# Patient Record
Sex: Female | Born: 1970 | Race: White | Hispanic: No | Marital: Married
Health system: Southern US, Community
[De-identification: ages and names within clinical notes are randomized; demographics above are authoritative.]

## PROBLEM LIST (undated history)

## (undated) DIAGNOSIS — M1990 Unspecified osteoarthritis, unspecified site: Secondary | ICD-10-CM

## (undated) DIAGNOSIS — D649 Anemia, unspecified: Secondary | ICD-10-CM

## (undated) HISTORY — DX: Unspecified osteoarthritis, unspecified site: M19.90

## (undated) HISTORY — DX: Anemia, unspecified: D64.9

## (undated) HISTORY — PX: TOTAL HIP ARTHROPLASTY: SHX124

## (undated) HISTORY — PX: CHOLECYSTECTOMY: SHX55

---

## 2005-02-06 ENCOUNTER — Other Ambulatory Visit: Admission: RE | Admit: 2005-02-06 | Discharge: 2005-02-06 | Payer: Self-pay | Admitting: Obstetrics and Gynecology

## 2005-07-12 ENCOUNTER — Encounter (INDEPENDENT_AMBULATORY_CARE_PROVIDER_SITE_OTHER): Payer: Self-pay | Admitting: *Deleted

## 2005-07-12 ENCOUNTER — Ambulatory Visit (HOSPITAL_COMMUNITY): Admission: RE | Admit: 2005-07-12 | Discharge: 2005-07-12 | Payer: Self-pay | Admitting: Obstetrics and Gynecology

## 2008-08-19 ENCOUNTER — Ambulatory Visit (HOSPITAL_COMMUNITY): Admission: RE | Admit: 2008-08-19 | Discharge: 2008-08-19 | Payer: Self-pay | Admitting: Internal Medicine

## 2008-10-11 ENCOUNTER — Ambulatory Visit (HOSPITAL_COMMUNITY): Admission: RE | Admit: 2008-10-11 | Discharge: 2008-10-11 | Payer: Self-pay | Admitting: Surgery

## 2008-10-11 ENCOUNTER — Encounter (INDEPENDENT_AMBULATORY_CARE_PROVIDER_SITE_OTHER): Payer: Self-pay | Admitting: Interventional Radiology

## 2009-07-15 HISTORY — PX: ABDOMINAL HYSTERECTOMY: SHX81

## 2009-08-21 ENCOUNTER — Encounter: Admission: RE | Admit: 2009-08-21 | Discharge: 2009-08-21 | Payer: Self-pay | Admitting: Surgery

## 2010-05-21 ENCOUNTER — Encounter (INDEPENDENT_AMBULATORY_CARE_PROVIDER_SITE_OTHER): Payer: Self-pay | Admitting: Obstetrics and Gynecology

## 2010-05-21 ENCOUNTER — Ambulatory Visit (HOSPITAL_COMMUNITY): Admission: RE | Admit: 2010-05-21 | Discharge: 2010-05-22 | Payer: Self-pay | Admitting: Obstetrics and Gynecology

## 2010-07-10 ENCOUNTER — Encounter
Admission: RE | Admit: 2010-07-10 | Discharge: 2010-07-10 | Payer: Self-pay | Source: Home / Self Care | Attending: Surgery | Admitting: Surgery

## 2010-08-04 ENCOUNTER — Other Ambulatory Visit: Payer: Self-pay | Admitting: Surgery

## 2010-08-04 DIAGNOSIS — E042 Nontoxic multinodular goiter: Secondary | ICD-10-CM

## 2010-08-05 ENCOUNTER — Encounter: Payer: Self-pay | Admitting: Surgery

## 2010-09-25 LAB — CBC
HCT: 30.9 % — ABNORMAL LOW (ref 36.0–46.0)
Hemoglobin: 10.6 g/dL — ABNORMAL LOW (ref 12.0–15.0)
MCH: 29.3 pg (ref 26.0–34.0)
MCH: 29.9 pg (ref 26.0–34.0)
MCHC: 33.7 g/dL (ref 30.0–36.0)
MCHC: 34.4 g/dL (ref 30.0–36.0)
MCV: 86.9 fL (ref 78.0–100.0)
Platelets: 216 10*3/uL (ref 150–400)
RBC: 4.52 MIL/uL (ref 3.87–5.11)

## 2010-11-30 NOTE — Op Note (Signed)
NAME:  [name], Carrie             ACCOUNT NO.:  [id number]   MEDICAL RECORD NO.:  [id number]          PATIENT TYPE:  AMB   LOCATION:  SDC                           FACILITY:  [facility]   PHYSICIAN:  Juluis Mire, M.D.   DATE OF BIRTH:  1970/12/13   DATE OF PROCEDURE:  07/12/2005  DATE OF DISCHARGE:                                 OPERATIVE REPORT   PREOPERATIVE DIAGNOSIS:  Menorrhagia.   POSTOPERATIVE DIAGNOSIS:  Menorrhagia.   OPERATION/PROCEDURE:  1.  Hysteroscopy.  2.  NovaSure ablation of the endometrium.   SURGEON:  Juluis Mire, M.D.   ANESTHESIA:  General.   ESTIMATED BLOOD LOSS:  Minimal.   PACKS AND DRAINS:  None.   INTRAOPERATIVE BLOOD REPLACED:  None.   COMPLICATIONS:  None.   INDICATIONS:  Dictated in history and physical.   DESCRIPTION OF PROCEDURE:  The patient was taken to the OR and placed in the  supine position.  After satisfactory level of general anesthesia was  obtained, the patient was placed in the dorsal lithotomy position using the  Allen stirrups.  Perineum and vagina were prepped out with Betadine and  draped in a sterile field.  Speculum was placed in the vaginal vault.  Uterus sounded to approximately 11 cm.  Cervix canal was 6 cm with a cavity  length of 5.  We dilated the cervix to a size 26 Pratt dilator.  Non-  operative hysteroscope was introduced.  Intrauterine cavity was distended  using lactated Ringer's.  Endometrial cavity was relatively large but there  was no evidence of polyps or other abnormalities. Endometrial curettings  were obtained and sent for pathologic review.  No signs of perforation were  noted. The NovaSure was then put in place and expanded.  We had a cavity  width of 5.2 cm.  The cavity assessment revealed no signs of perforation.  Ablation was undertaken for 85 seconds at a power of  143.  The NovaSure was then removed intact.  Speculum and single-tooth  tenaculum were removed.  The patient was taken out of the  dorsal lithotomy  position and once alert and extubated, was transferred to the recovery room  in good condition.  Sponge, instrument and needle counts reported as correct  by the circulating nurse.      Juluis Mire, M.D.  Electronically Signed     JSM/MEDQ  D:  07/12/2005  T:  07/12/2005  Job:  [id number]

## 2010-11-30 NOTE — H&P (Signed)
NAME:  Carrie [name], Carrie [name] NO.:  [id number]   MEDICAL RECORD NO.:  [id number]         PATIENT TYPE:  AMB   LOCATION:                                FACILITY:  [facility]   PHYSICIAN:  Juluis Mire, M.D.   DATE OF BIRTH:  31-Aug-1970   DATE OF ADMISSION:  07/12/2005  DATE OF DISCHARGE:  07/12/2005                                HISTORY & PHYSICAL   40 year old gravida 3, para 2, abortus 1 female presents for hysteroscopy  and NovaSure ablation.   In relation to the present admission patient's cycles are regular.  She has  three to seven days of flow, three days being heavy changing pads and  tampons every 15 minutes with significant clots and cramping.  She also has  some pains during deep penetration.  A saline infusion ultrasound had  revealed some small intramural fibroids, otherwise endometrium was  unremarkable.  No polyps were noted.  We had discussed options including  birth control pills versus Mirena IUD versus ablation versus hysterectomy.  The patient now presents for ablation.  Her husband has had a prior  vasectomy.   ALLERGIES:  She has no known drug allergies.   MEDICATIONS:  None.   PAST MEDICAL HISTORY:  Usual childhood diseases.  No significant sequelae.  She has no previous surgical history.  She has had two vaginal deliveries  and one TAB.   FAMILY HISTORY:  Noncontributory.   SOCIAL HISTORY:  Noncontributory.   REVIEW OF SYSTEMS:  Noncontributory.   PHYSICAL EXAMINATION:  VITAL SIGNS:  Patient is afebrile with stable vital  signs.  HEENT:  Patient normocephalic.  Pupils are equal, round, and reactive to  light and accommodation.  Extraocular movements are intact.  Sclerae and  conjunctivae clear.  Oropharynx clear.  NECK:  Without thyromegaly.  BREASTS:  No discrete masses.  LUNGS:  Clear.  CARDIOVASCULAR:  Regular rhythm, rate without murmurs or gallops.  ABDOMEN:  Benign.  PELVIC:  Normal external genitalia.  Vaginal mucosa is clear.   Cervix  unremarkable.  Uterus normal size, shape, and contour.  Adnexa free of  masses or tenderness.  EXTREMITIES:  Trace edema.  NEUROLOGIC:  Grossly within normal limits.   IMPRESSION:  1.  Menorrhagia secondary to adenomyosis.  2.  Uterine fibroids.   PLAN:  Patient undergo hysteroscopic evaluation, biopsy, and endometrial  ablation.  The risks of surgery have been discussed including the risk of  infection, risk of vascular injury that could lead to hemorrhage requiring  transfusion and possible hysterectomy, risk of injury to adjacent organs  through perforation that could require further exploratory surgery, risk of  deep venous thrombosis and pulmonary embolus.  Patient expressed  understanding of indications, risks, and alternatives.      Juluis Mire, M.D.  Electronically Signed     JSM/MEDQ  D:  07/12/2005  T:  07/12/2005  Job:  [id number]

## 2010-12-05 ENCOUNTER — Encounter (INDEPENDENT_AMBULATORY_CARE_PROVIDER_SITE_OTHER): Payer: Self-pay | Admitting: Surgery

## 2011-06-18 ENCOUNTER — Ambulatory Visit
Admission: RE | Admit: 2011-06-18 | Discharge: 2011-06-18 | Disposition: A | Discharge status: Home / Self Care | Payer: BC Managed Care – PPO | Source: Ambulatory Visit | Attending: Surgery | Admitting: Surgery

## 2011-06-18 DIAGNOSIS — E042 Nontoxic multinodular goiter: Secondary | ICD-10-CM

## 2012-12-15 IMAGING — US US SOFT TISSUE HEAD/NECK
1 series · 14 of 25 positions shown · non-contrast
Comparison: 08/21/2009

CLINICAL DATA: Thyroid nodules.

THYROID ULTRASOUND
TECHNIQUE: Ultrasound examination of the thyroid gland and adjacent
soft tissues was performed.

[Series 1: us soft tissue head/neck · 0.07mm/px · 14 of 48 slices shown]
[im 1/48]
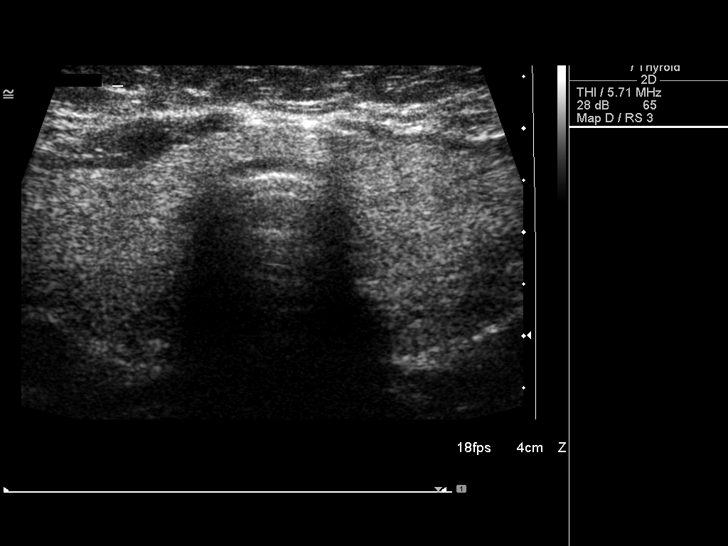
[im 4/48]
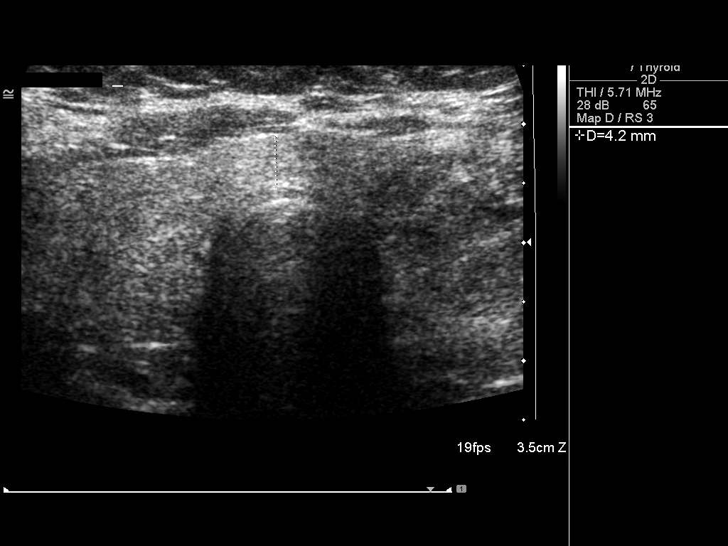
[im 8/48]
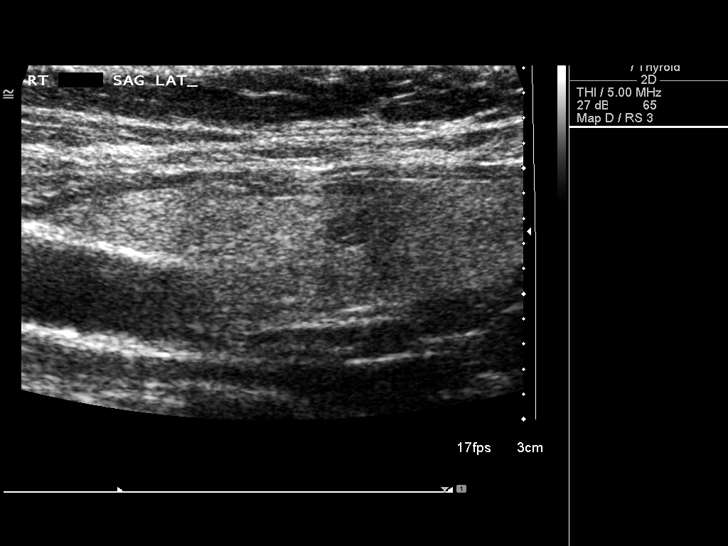
[im 12/48]
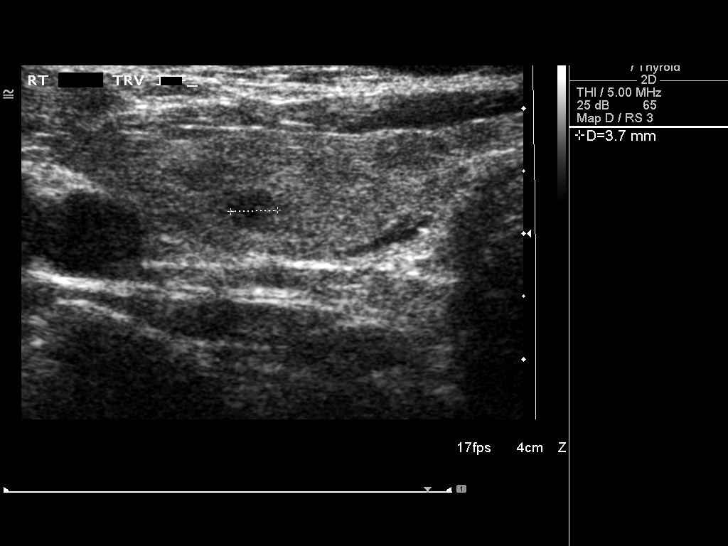
[im 16/48]
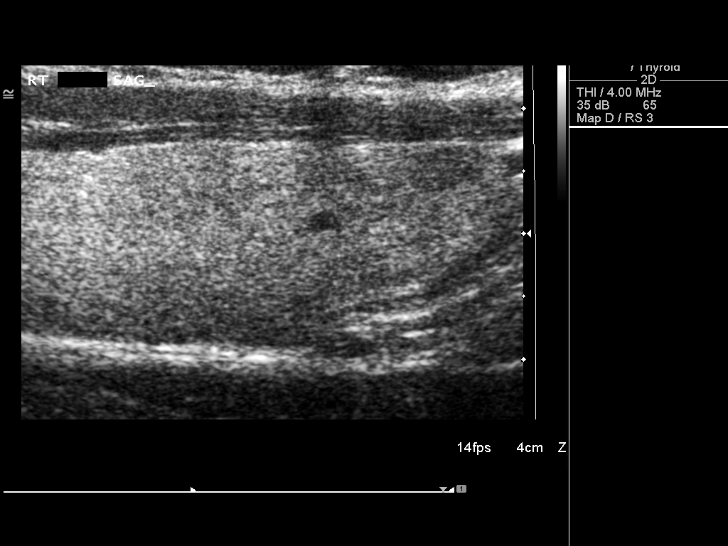
[im 18/48]
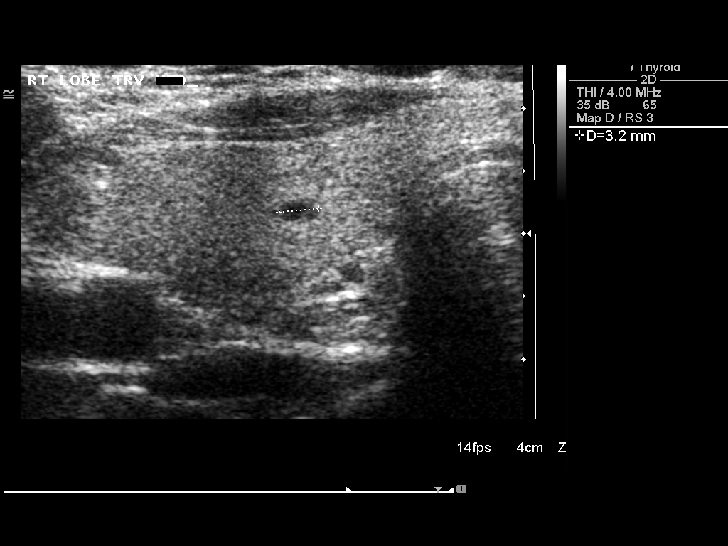
[im 22/48]
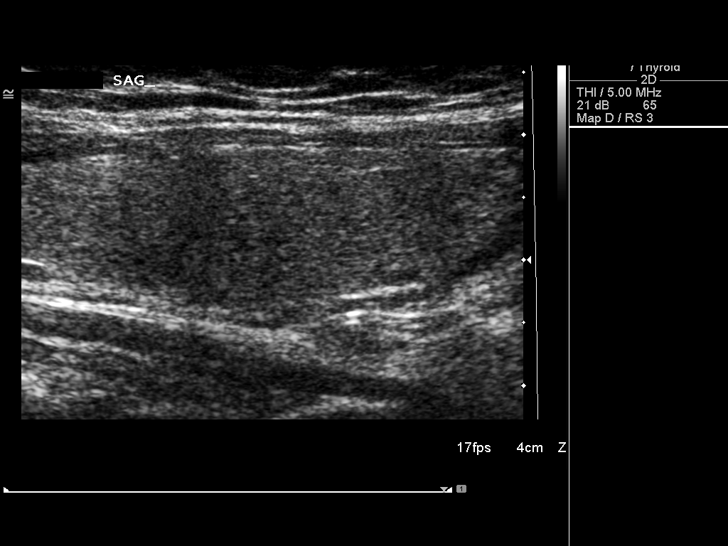
[im 26/48]
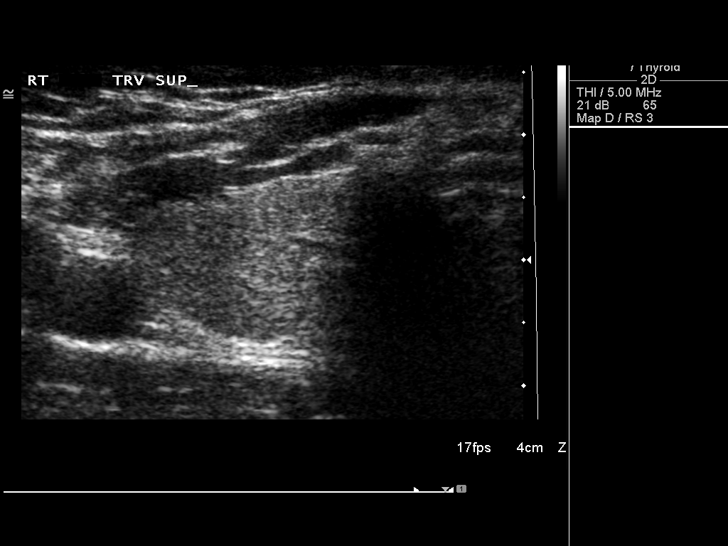
[im 30/48]
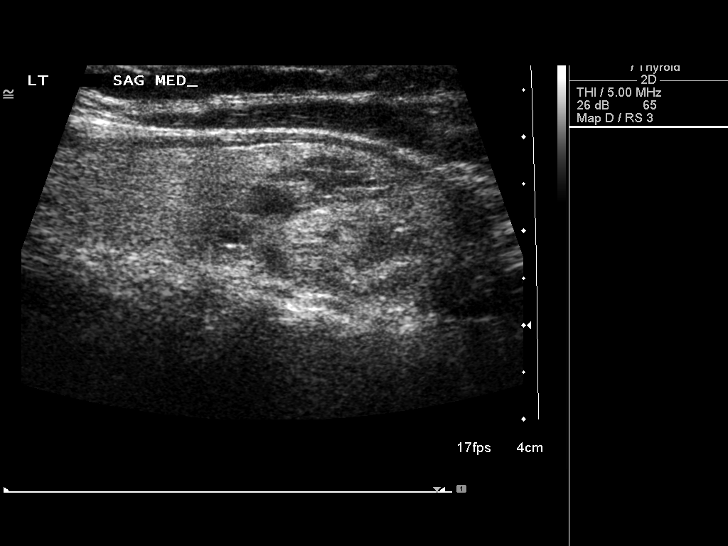
[im 32/48]
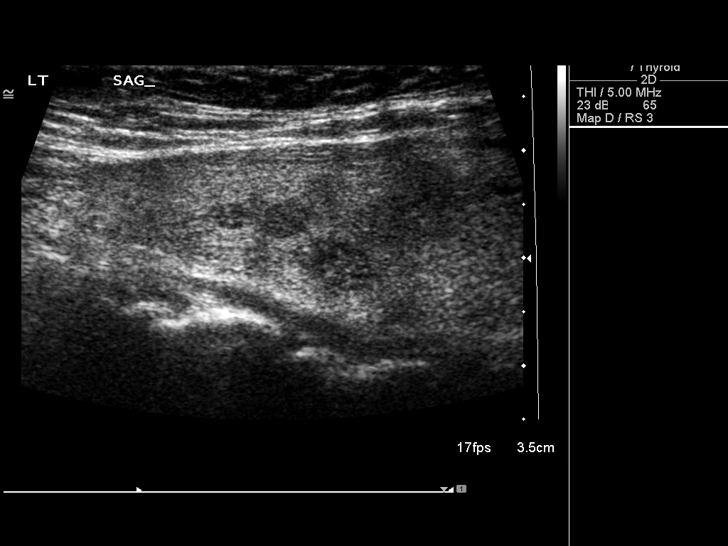
[im 36/48]
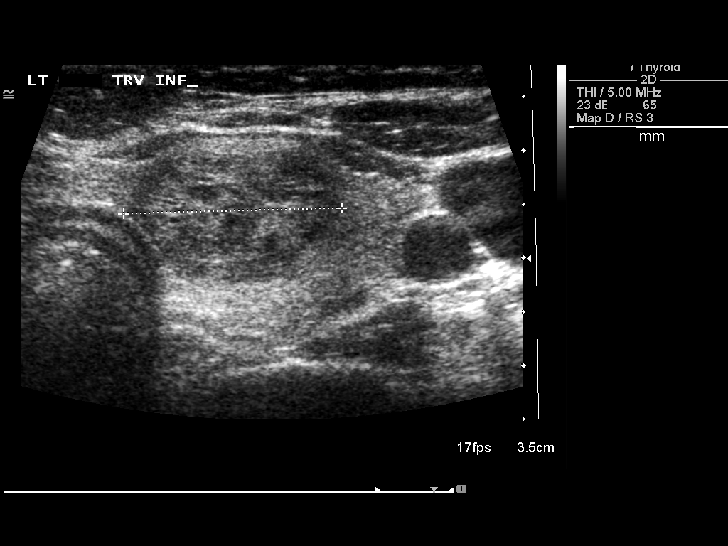
[im 40/48]
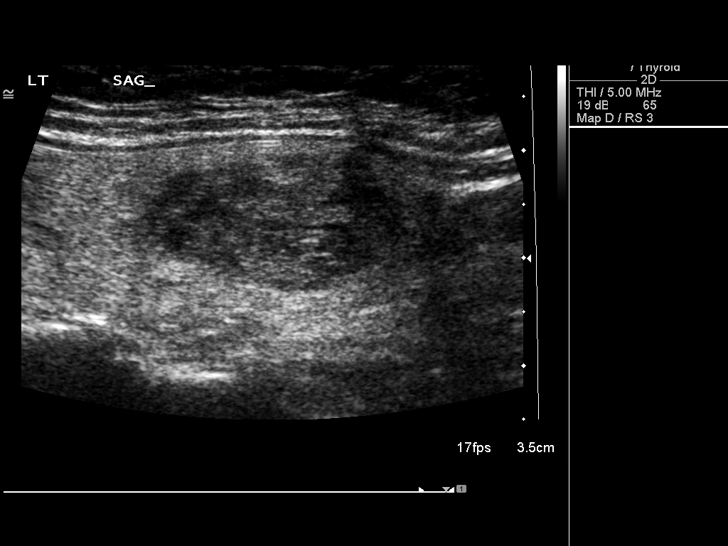
[im 44/48]
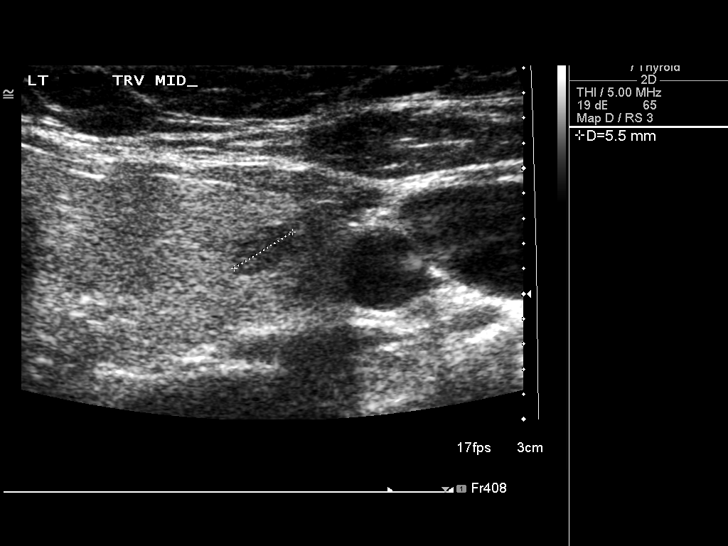
[im 48/48]
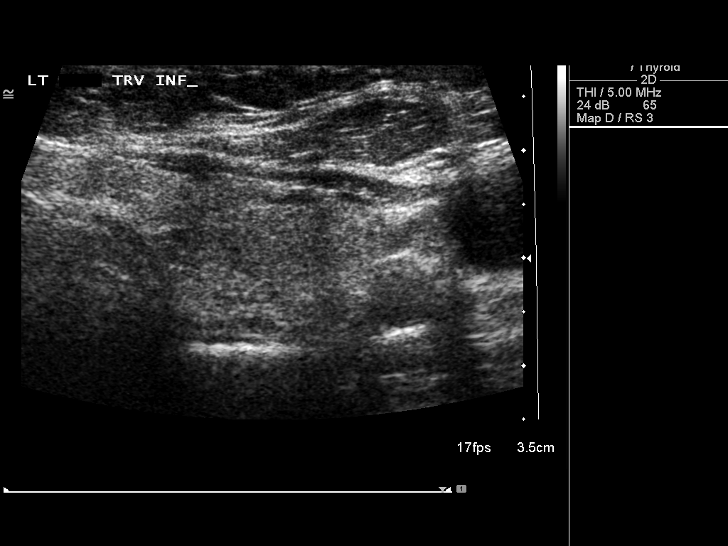

[14 of 25 positions shown; findings below may reference images not displayed]

FINDINGS: Right thyroid lobe:  5.1 x 1.9 x 2.6 cm
Left thyroid lobe:  5.4 x 1.8 x 2.2 cm
Isthmus:  0.4 cm

Focal nodules:  There are multiple small nodules in both lobes.
There is a dominant nodule in the medial aspect of the lower pole
of the left lobe measuring 2.5 x 1.3 x 2.0 cm, essentially
unchanged since the prior exam.

Lymphadenopathy:  None visualized.
IMPRESSION: Multi nodular goiter with no significant change since
the prior exam.

## 2019-09-20 ENCOUNTER — Other Ambulatory Visit: Payer: Self-pay | Admitting: Obstetrics and Gynecology

## 2019-09-20 DIAGNOSIS — E041 Nontoxic single thyroid nodule: Secondary | ICD-10-CM

## 2019-09-24 ENCOUNTER — Other Ambulatory Visit: Payer: Self-pay

## 2019-09-27 ENCOUNTER — Ambulatory Visit
Admission: RE | Admit: 2019-09-27 | Discharge: 2019-09-27 | Disposition: A | Discharge status: Home / Self Care | Payer: Managed Care, Other (non HMO) | Source: Ambulatory Visit | Attending: Obstetrics and Gynecology | Admitting: Obstetrics and Gynecology

## 2019-09-27 DIAGNOSIS — E041 Nontoxic single thyroid nodule: Secondary | ICD-10-CM

## 2020-04-26 DIAGNOSIS — M545 Low back pain, unspecified: Secondary | ICD-10-CM | POA: Diagnosis not present

## 2020-04-26 DIAGNOSIS — M25551 Pain in right hip: Secondary | ICD-10-CM | POA: Diagnosis not present

## 2020-06-15 ENCOUNTER — Other Ambulatory Visit: Payer: Self-pay | Admitting: Obstetrics and Gynecology

## 2020-06-15 DIAGNOSIS — E041 Nontoxic single thyroid nodule: Secondary | ICD-10-CM

## 2020-06-22 DIAGNOSIS — S338XXA Sprain of other parts of lumbar spine and pelvis, initial encounter: Secondary | ICD-10-CM | POA: Diagnosis not present

## 2020-06-22 DIAGNOSIS — S134XXA Sprain of ligaments of cervical spine, initial encounter: Secondary | ICD-10-CM | POA: Diagnosis not present

## 2020-06-22 DIAGNOSIS — S233XXA Sprain of ligaments of thoracic spine, initial encounter: Secondary | ICD-10-CM | POA: Diagnosis not present

## 2020-09-19 DIAGNOSIS — R002 Palpitations: Secondary | ICD-10-CM | POA: Diagnosis not present

## 2020-09-19 DIAGNOSIS — Z20822 Contact with and (suspected) exposure to covid-19: Secondary | ICD-10-CM | POA: Diagnosis not present

## 2020-09-19 DIAGNOSIS — Z7689 Persons encountering health services in other specified circumstances: Secondary | ICD-10-CM | POA: Diagnosis not present

## 2020-09-19 DIAGNOSIS — E785 Hyperlipidemia, unspecified: Secondary | ICD-10-CM | POA: Diagnosis not present

## 2020-09-19 DIAGNOSIS — Z20828 Contact with and (suspected) exposure to other viral communicable diseases: Secondary | ICD-10-CM | POA: Diagnosis not present

## 2020-10-02 DIAGNOSIS — J111 Influenza due to unidentified influenza virus with other respiratory manifestations: Secondary | ICD-10-CM | POA: Diagnosis not present

## 2020-10-02 DIAGNOSIS — Z20828 Contact with and (suspected) exposure to other viral communicable diseases: Secondary | ICD-10-CM | POA: Diagnosis not present

## 2020-10-02 DIAGNOSIS — J029 Acute pharyngitis, unspecified: Secondary | ICD-10-CM | POA: Diagnosis not present

## 2020-11-03 DIAGNOSIS — G4700 Insomnia, unspecified: Secondary | ICD-10-CM | POA: Diagnosis not present

## 2020-11-03 DIAGNOSIS — Z20828 Contact with and (suspected) exposure to other viral communicable diseases: Secondary | ICD-10-CM | POA: Diagnosis not present

## 2020-11-03 DIAGNOSIS — R002 Palpitations: Secondary | ICD-10-CM | POA: Diagnosis not present

## 2020-11-09 DIAGNOSIS — R002 Palpitations: Secondary | ICD-10-CM | POA: Diagnosis not present

## 2020-12-06 DIAGNOSIS — Z1331 Encounter for screening for depression: Secondary | ICD-10-CM | POA: Diagnosis not present

## 2020-12-06 DIAGNOSIS — F411 Generalized anxiety disorder: Secondary | ICD-10-CM | POA: Diagnosis not present

## 2020-12-06 DIAGNOSIS — G4700 Insomnia, unspecified: Secondary | ICD-10-CM | POA: Diagnosis not present

## 2020-12-06 DIAGNOSIS — Z1389 Encounter for screening for other disorder: Secondary | ICD-10-CM | POA: Diagnosis not present

## 2021-01-01 DIAGNOSIS — Z6830 Body mass index (BMI) 30.0-30.9, adult: Secondary | ICD-10-CM | POA: Diagnosis not present

## 2021-01-01 DIAGNOSIS — Z01419 Encounter for gynecological examination (general) (routine) without abnormal findings: Secondary | ICD-10-CM | POA: Diagnosis not present

## 2021-01-01 DIAGNOSIS — Z1231 Encounter for screening mammogram for malignant neoplasm of breast: Secondary | ICD-10-CM | POA: Diagnosis not present

## 2021-01-22 DIAGNOSIS — E042 Nontoxic multinodular goiter: Secondary | ICD-10-CM | POA: Diagnosis not present

## 2021-01-22 DIAGNOSIS — Z8639 Personal history of other endocrine, nutritional and metabolic disease: Secondary | ICD-10-CM | POA: Diagnosis not present

## 2021-01-22 DIAGNOSIS — Z8349 Family history of other endocrine, nutritional and metabolic diseases: Secondary | ICD-10-CM | POA: Diagnosis not present

## 2021-01-22 DIAGNOSIS — E781 Pure hyperglyceridemia: Secondary | ICD-10-CM | POA: Diagnosis not present

## 2021-02-13 ENCOUNTER — Other Ambulatory Visit: Payer: Self-pay | Admitting: Obstetrics and Gynecology

## 2021-02-13 DIAGNOSIS — E041 Nontoxic single thyroid nodule: Secondary | ICD-10-CM

## 2021-02-21 ENCOUNTER — Other Ambulatory Visit: Payer: Managed Care, Other (non HMO)

## 2021-02-21 ENCOUNTER — Other Ambulatory Visit: Payer: Self-pay

## 2021-02-21 ENCOUNTER — Ambulatory Visit
Admission: RE | Admit: 2021-02-21 | Discharge: 2021-02-21 | Disposition: A | Discharge status: Home / Self Care | Payer: Managed Care, Other (non HMO) | Source: Ambulatory Visit | Attending: Internal Medicine | Admitting: Internal Medicine

## 2021-02-21 ENCOUNTER — Other Ambulatory Visit: Payer: Self-pay | Admitting: Internal Medicine

## 2021-02-21 DIAGNOSIS — Z8349 Family history of other endocrine, nutritional and metabolic diseases: Secondary | ICD-10-CM

## 2021-02-21 DIAGNOSIS — E042 Nontoxic multinodular goiter: Secondary | ICD-10-CM

## 2021-02-21 DIAGNOSIS — E041 Nontoxic single thyroid nodule: Secondary | ICD-10-CM | POA: Diagnosis not present

## 2021-03-23 DIAGNOSIS — S233XXA Sprain of ligaments of thoracic spine, initial encounter: Secondary | ICD-10-CM | POA: Diagnosis not present

## 2021-03-23 DIAGNOSIS — S134XXA Sprain of ligaments of cervical spine, initial encounter: Secondary | ICD-10-CM | POA: Diagnosis not present

## 2021-03-30 DIAGNOSIS — S233XXA Sprain of ligaments of thoracic spine, initial encounter: Secondary | ICD-10-CM | POA: Diagnosis not present

## 2021-03-30 DIAGNOSIS — S134XXA Sprain of ligaments of cervical spine, initial encounter: Secondary | ICD-10-CM | POA: Diagnosis not present

## 2021-04-06 DIAGNOSIS — S134XXA Sprain of ligaments of cervical spine, initial encounter: Secondary | ICD-10-CM | POA: Diagnosis not present

## 2021-04-06 DIAGNOSIS — S233XXA Sprain of ligaments of thoracic spine, initial encounter: Secondary | ICD-10-CM | POA: Diagnosis not present

## 2021-04-12 DIAGNOSIS — S134XXA Sprain of ligaments of cervical spine, initial encounter: Secondary | ICD-10-CM | POA: Diagnosis not present

## 2021-04-12 DIAGNOSIS — S233XXA Sprain of ligaments of thoracic spine, initial encounter: Secondary | ICD-10-CM | POA: Diagnosis not present

## 2021-04-26 DIAGNOSIS — S134XXA Sprain of ligaments of cervical spine, initial encounter: Secondary | ICD-10-CM | POA: Diagnosis not present

## 2021-04-26 DIAGNOSIS — S233XXA Sprain of ligaments of thoracic spine, initial encounter: Secondary | ICD-10-CM | POA: Diagnosis not present

## 2021-06-03 DIAGNOSIS — M791 Myalgia, unspecified site: Secondary | ICD-10-CM | POA: Diagnosis not present

## 2021-06-03 DIAGNOSIS — J029 Acute pharyngitis, unspecified: Secondary | ICD-10-CM | POA: Diagnosis not present

## 2021-06-03 DIAGNOSIS — J209 Acute bronchitis, unspecified: Secondary | ICD-10-CM | POA: Diagnosis not present

## 2021-06-03 DIAGNOSIS — Z20822 Contact with and (suspected) exposure to covid-19: Secondary | ICD-10-CM | POA: Diagnosis not present

## 2021-07-06 DIAGNOSIS — Z20822 Contact with and (suspected) exposure to covid-19: Secondary | ICD-10-CM | POA: Diagnosis not present

## 2021-07-06 DIAGNOSIS — U071 COVID-19: Secondary | ICD-10-CM | POA: Diagnosis not present

## 2021-10-24 DIAGNOSIS — S233XXA Sprain of ligaments of thoracic spine, initial encounter: Secondary | ICD-10-CM | POA: Diagnosis not present

## 2021-10-24 DIAGNOSIS — S134XXA Sprain of ligaments of cervical spine, initial encounter: Secondary | ICD-10-CM | POA: Diagnosis not present

## 2021-11-07 DIAGNOSIS — S233XXA Sprain of ligaments of thoracic spine, initial encounter: Secondary | ICD-10-CM | POA: Diagnosis not present

## 2021-11-07 DIAGNOSIS — S134XXA Sprain of ligaments of cervical spine, initial encounter: Secondary | ICD-10-CM | POA: Diagnosis not present

## 2022-02-13 DIAGNOSIS — Z1272 Encounter for screening for malignant neoplasm of vagina: Secondary | ICD-10-CM | POA: Diagnosis not present

## 2022-02-13 DIAGNOSIS — Z1231 Encounter for screening mammogram for malignant neoplasm of breast: Secondary | ICD-10-CM | POA: Diagnosis not present

## 2022-02-13 DIAGNOSIS — Z01419 Encounter for gynecological examination (general) (routine) without abnormal findings: Secondary | ICD-10-CM | POA: Diagnosis not present

## 2022-02-13 DIAGNOSIS — Z1151 Encounter for screening for human papillomavirus (HPV): Secondary | ICD-10-CM | POA: Diagnosis not present

## 2022-02-13 DIAGNOSIS — Z6831 Body mass index (BMI) 31.0-31.9, adult: Secondary | ICD-10-CM | POA: Diagnosis not present

## 2022-02-15 ENCOUNTER — Other Ambulatory Visit: Payer: Self-pay | Admitting: Obstetrics and Gynecology

## 2022-02-15 DIAGNOSIS — E042 Nontoxic multinodular goiter: Secondary | ICD-10-CM

## 2022-02-18 ENCOUNTER — Other Ambulatory Visit: Payer: Self-pay | Admitting: Obstetrics and Gynecology

## 2022-02-18 DIAGNOSIS — R928 Other abnormal and inconclusive findings on diagnostic imaging of breast: Secondary | ICD-10-CM

## 2022-02-27 ENCOUNTER — Ambulatory Visit
Admission: RE | Admit: 2022-02-27 | Discharge: 2022-02-27 | Disposition: A | Discharge status: Home / Self Care | Payer: BC Managed Care – PPO | Source: Ambulatory Visit | Attending: Obstetrics and Gynecology | Admitting: Obstetrics and Gynecology

## 2022-02-27 DIAGNOSIS — E042 Nontoxic multinodular goiter: Secondary | ICD-10-CM

## 2022-03-04 IMAGING — US US THYROID
1 series · 13 of 25 positions shown · non-contrast
Comparison: 06/18/2011 and 07/10/2010

CLINICAL DATA: Follow-up thyroid nodules. Dominant left thyroid
nodule was biopsied in 8848.

EXAM:
THYROID ULTRASOUND
TECHNIQUE: Ultrasound examination of the thyroid gland and adjacent soft
tissues was performed.

[Series 1: us thyroid · 0.07mm/px · 13 of 46 slices shown]
[im 1/46]
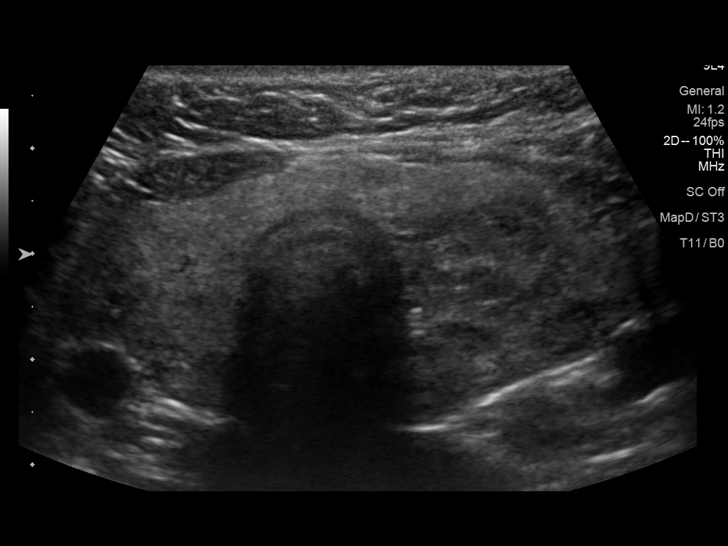
[im 4/46]
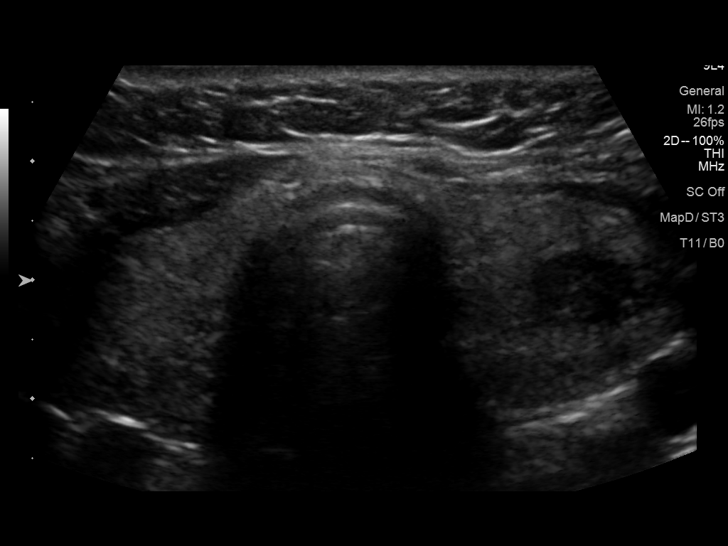
[im 8/46]
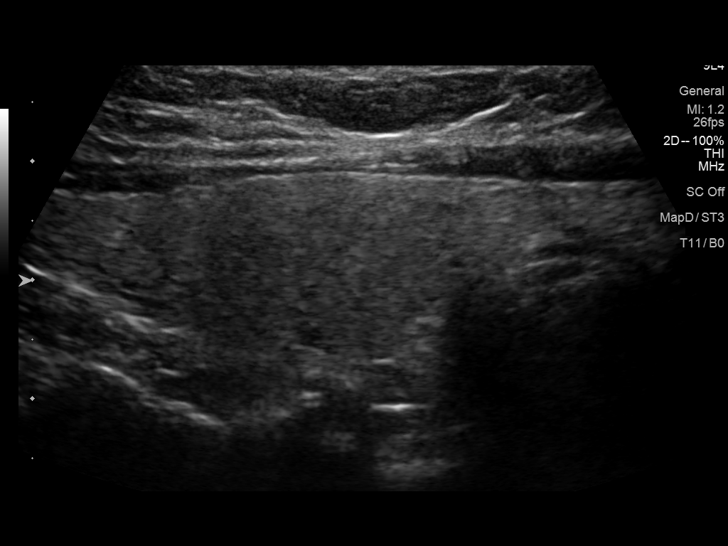
[im 12/46]
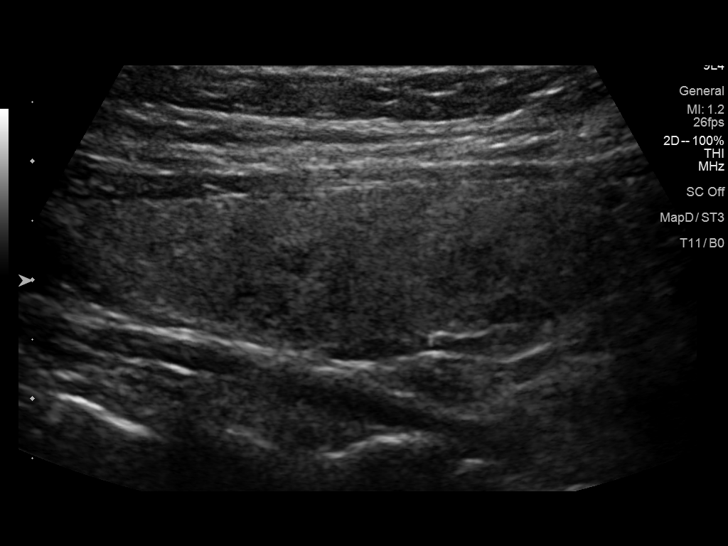
[im 16/46]
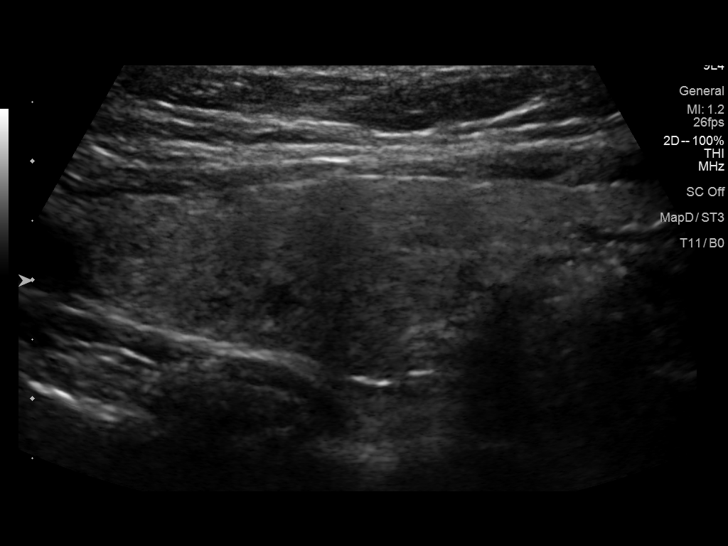
[im 19/46]
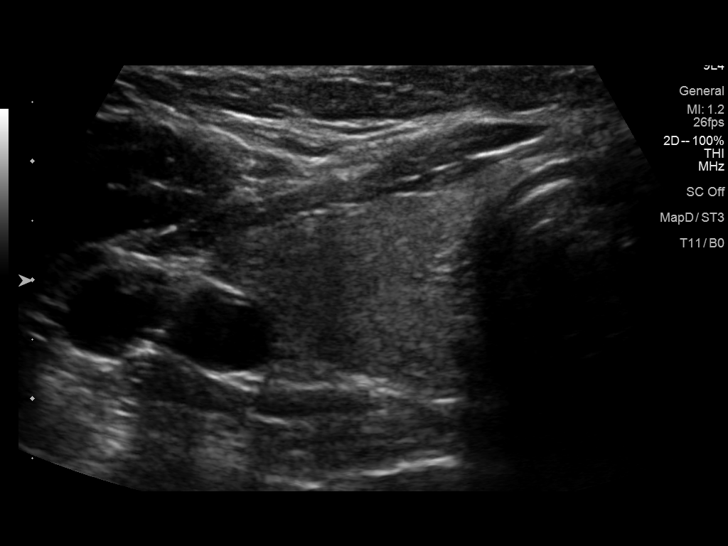
[im 23/46]
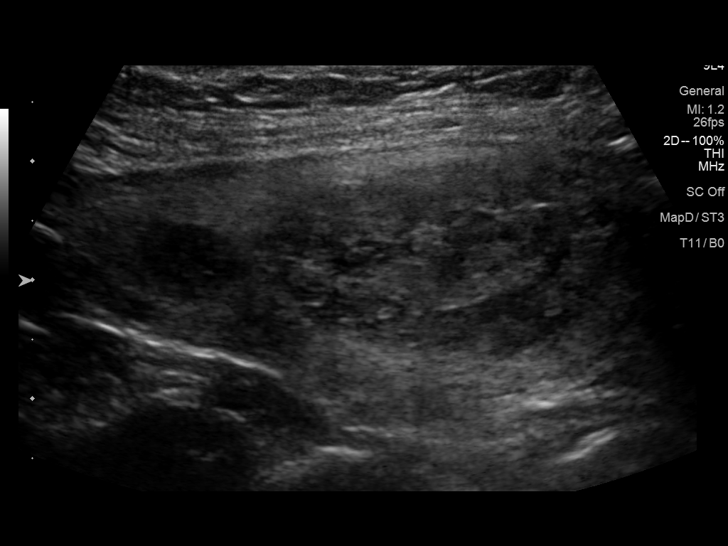
[im 27/46]
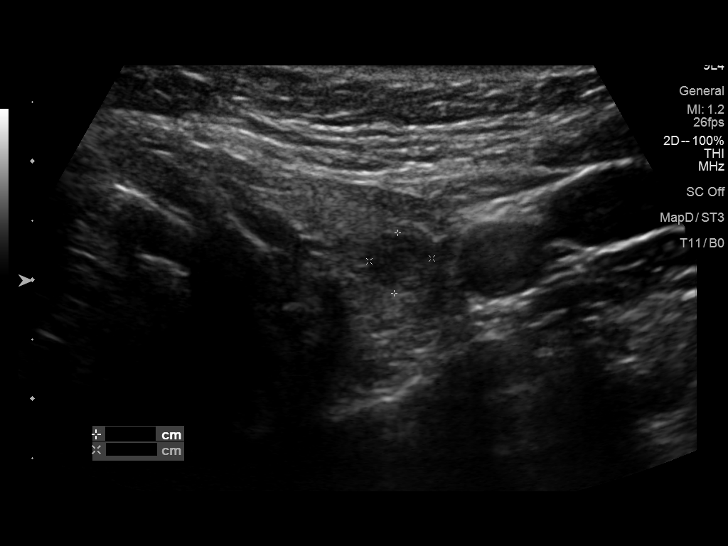
[im 31/46]
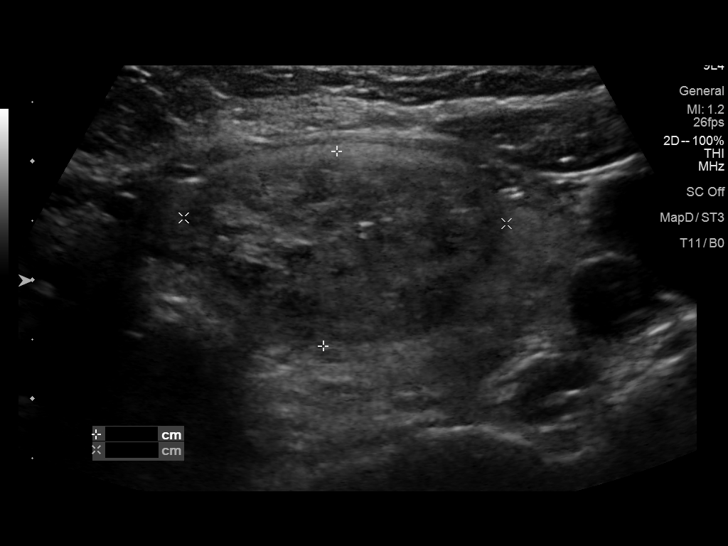
[im 34/46]
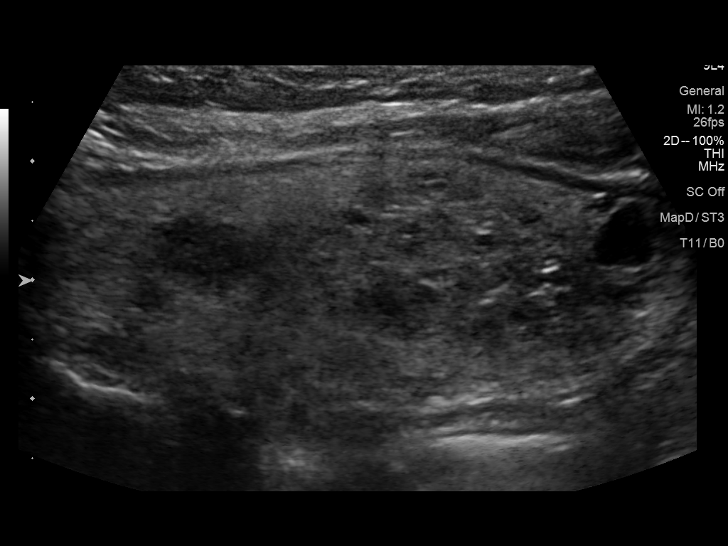
[im 38/46]
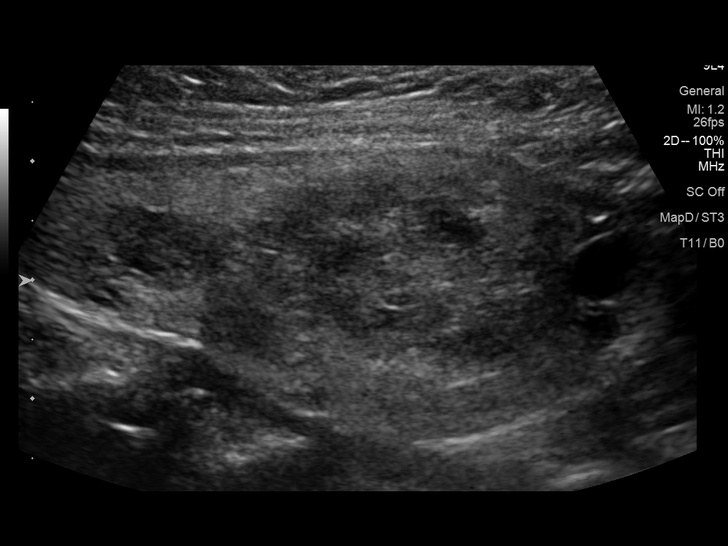
[im 42/46]
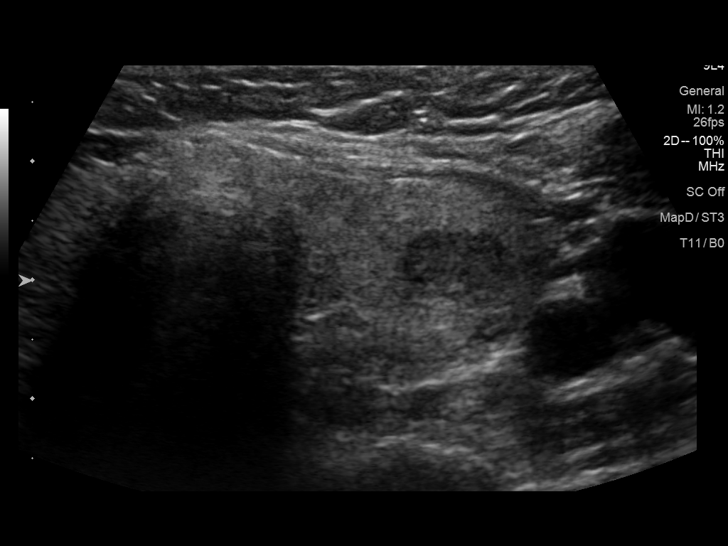
[im 46/46]
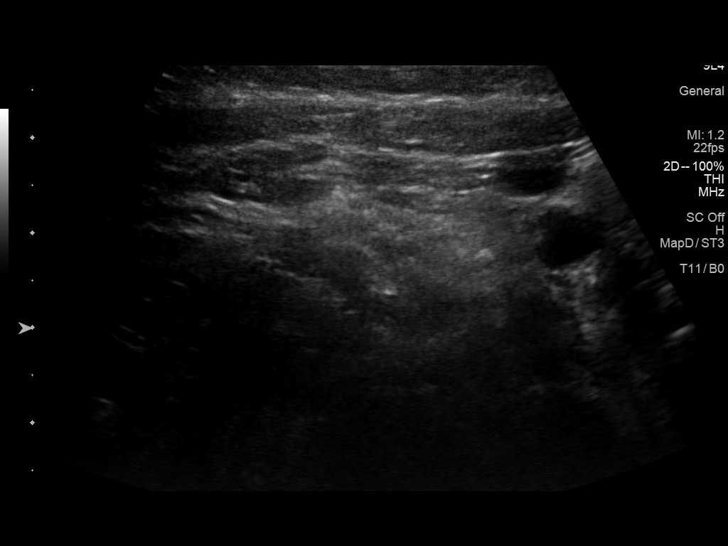

[13 of 25 positions shown; findings below may reference images not displayed]

FINDINGS: Parenchymal Echotexture: Moderately heterogenous

Isthmus: 0.5 cm, previously 0.5 cm

Right lobe: 5.3 x 1.6 x 2.1 cm, previously 5.1 x 1.2 x 2.6 cm

Left lobe: 5.2 x 2.5 x 2.3 cm, previously 5.6 x 1.9 x 2.4 cm

_________________________________________________________

Estimated total number of nodules >/= 1 cm: 3

Number of spongiform nodules >/=  2 cm not described below (TR1): 0

Number of mixed cystic and solid nodules >/= 1.5 cm not described
below (TR2): 0

_________________________________________________________

Small right thyroid nodules that do not meet criteria for biopsy or
dedicated follow-up.

Nodule 2 in the superior/mid left thyroid lobe measures 0.9 x 0.6 x
1.0 cm. This nodule has probably minimally changed but difficult to
compare from the previous examinations.

Nodule 3 is a dominant nodule in the inferior left thyroid lobe.
This appears to represent the previously biopsied nodule. This
nodule is slightly hypoechoic and heterogeneous. Dominant nodule
measures 3.2 x 1.6 x 2.7 cm and previously measured 2.5 x 1.2 x
cm.

Nodule 4 in the inferior left thyroid lobe is a mixed cystic and
solid nodule that measures 1.1 x 0.9 x 0.9 cm. This nodule does not
meet criteria for biopsy or dedicated follow-up.
IMPRESSION: 1. Multiple thyroid nodules.
2. Dominant nodule in the inferior left thyroid lobe has enlarged.
This dominant nodule was biopsied in 8848.
3. Nodule 2 in the mid left thyroid lobe is a TR 4 nodule that meets
criteria for 1 year follow-up. Difficult to know if this nodule is
stable because there are multiple small hypoechoic nodules
throughout the thyroid. Consider 1 year follow-up.

The above is in keeping with the ACR TI-RADS recommendations - [HOSPITAL] 8212;[DATE].

## 2022-03-06 ENCOUNTER — Ambulatory Visit
Admission: RE | Admit: 2022-03-06 | Discharge: 2022-03-06 | Disposition: A | Discharge status: Home / Self Care | Payer: BC Managed Care – PPO | Source: Ambulatory Visit | Attending: Obstetrics and Gynecology | Admitting: Obstetrics and Gynecology

## 2022-03-06 ENCOUNTER — Other Ambulatory Visit: Payer: Self-pay | Admitting: Obstetrics and Gynecology

## 2022-03-06 DIAGNOSIS — R922 Inconclusive mammogram: Secondary | ICD-10-CM | POA: Diagnosis not present

## 2022-03-06 DIAGNOSIS — R928 Other abnormal and inconclusive findings on diagnostic imaging of breast: Secondary | ICD-10-CM

## 2022-03-06 DIAGNOSIS — N6489 Other specified disorders of breast: Secondary | ICD-10-CM | POA: Diagnosis not present

## 2022-03-08 ENCOUNTER — Other Ambulatory Visit: Payer: Self-pay | Admitting: Obstetrics and Gynecology

## 2022-03-08 DIAGNOSIS — E041 Nontoxic single thyroid nodule: Secondary | ICD-10-CM

## 2022-03-14 DIAGNOSIS — Z6831 Body mass index (BMI) 31.0-31.9, adult: Secondary | ICD-10-CM | POA: Diagnosis not present

## 2022-03-14 DIAGNOSIS — M25511 Pain in right shoulder: Secondary | ICD-10-CM | POA: Diagnosis not present

## 2022-03-14 DIAGNOSIS — M7520 Bicipital tendinitis, unspecified shoulder: Secondary | ICD-10-CM | POA: Diagnosis not present

## 2022-03-28 DIAGNOSIS — Z0001 Encounter for general adult medical examination with abnormal findings: Secondary | ICD-10-CM | POA: Diagnosis not present

## 2022-03-28 DIAGNOSIS — E041 Nontoxic single thyroid nodule: Secondary | ICD-10-CM | POA: Diagnosis not present

## 2022-03-28 DIAGNOSIS — E7849 Other hyperlipidemia: Secondary | ICD-10-CM | POA: Diagnosis not present

## 2022-03-28 DIAGNOSIS — R7989 Other specified abnormal findings of blood chemistry: Secondary | ICD-10-CM | POA: Diagnosis not present

## 2022-04-03 ENCOUNTER — Ambulatory Visit
Admission: RE | Admit: 2022-04-03 | Discharge: 2022-04-03 | Disposition: A | Discharge status: Home / Self Care | Payer: BC Managed Care – PPO | Source: Ambulatory Visit | Attending: Obstetrics and Gynecology | Admitting: Obstetrics and Gynecology

## 2022-04-03 ENCOUNTER — Other Ambulatory Visit: Payer: Self-pay | Admitting: Obstetrics and Gynecology

## 2022-04-03 DIAGNOSIS — E041 Nontoxic single thyroid nodule: Secondary | ICD-10-CM | POA: Diagnosis not present

## 2022-04-24 DIAGNOSIS — E042 Nontoxic multinodular goiter: Secondary | ICD-10-CM | POA: Diagnosis not present

## 2022-07-24 DIAGNOSIS — S134XXA Sprain of ligaments of cervical spine, initial encounter: Secondary | ICD-10-CM | POA: Diagnosis not present

## 2022-07-24 DIAGNOSIS — S233XXA Sprain of ligaments of thoracic spine, initial encounter: Secondary | ICD-10-CM | POA: Diagnosis not present

## 2022-07-24 DIAGNOSIS — S338XXA Sprain of other parts of lumbar spine and pelvis, initial encounter: Secondary | ICD-10-CM | POA: Diagnosis not present

## 2022-09-11 ENCOUNTER — Ambulatory Visit: Admission: RE | Admit: 2022-09-11 | Payer: BC Managed Care – PPO | Source: Ambulatory Visit

## 2022-09-11 ENCOUNTER — Ambulatory Visit
Admission: RE | Admit: 2022-09-11 | Discharge: 2022-09-11 | Disposition: A | Discharge status: Home / Self Care | Payer: BC Managed Care – PPO | Source: Ambulatory Visit | Attending: Obstetrics and Gynecology | Admitting: Obstetrics and Gynecology

## 2022-09-11 DIAGNOSIS — R922 Inconclusive mammogram: Secondary | ICD-10-CM | POA: Diagnosis not present

## 2022-09-11 DIAGNOSIS — N6489 Other specified disorders of breast: Secondary | ICD-10-CM

## 2022-09-26 ENCOUNTER — Other Ambulatory Visit: Payer: Self-pay | Admitting: Obstetrics and Gynecology

## 2022-09-26 DIAGNOSIS — N6489 Other specified disorders of breast: Secondary | ICD-10-CM

## 2022-10-15 ENCOUNTER — Emergency Department (HOSPITAL_COMMUNITY): Payer: BC Managed Care – PPO

## 2022-10-15 ENCOUNTER — Encounter (HOSPITAL_COMMUNITY): Payer: Self-pay

## 2022-10-15 ENCOUNTER — Other Ambulatory Visit: Payer: Self-pay

## 2022-10-15 ENCOUNTER — Emergency Department (HOSPITAL_COMMUNITY)
Admission: EM | Admit: 2022-10-15 | Discharge: 2022-10-15 | Disposition: A | Discharge status: Home / Self Care | Payer: BC Managed Care – PPO | Attending: Emergency Medicine | Admitting: Emergency Medicine

## 2022-10-15 DIAGNOSIS — R0602 Shortness of breath: Secondary | ICD-10-CM | POA: Insufficient documentation

## 2022-10-15 DIAGNOSIS — M7989 Other specified soft tissue disorders: Secondary | ICD-10-CM | POA: Diagnosis not present

## 2022-10-15 DIAGNOSIS — R002 Palpitations: Secondary | ICD-10-CM | POA: Insufficient documentation

## 2022-10-15 DIAGNOSIS — R600 Localized edema: Secondary | ICD-10-CM | POA: Diagnosis not present

## 2022-10-15 LAB — BASIC METABOLIC PANEL
Anion gap: 12 (ref 5–15)
BUN: 15 mg/dL (ref 6–20)
CO2: 22 mmol/L (ref 22–32)
Calcium: 9.3 mg/dL (ref 8.9–10.3)
Chloride: 104 mmol/L (ref 98–111)
Creatinine, Ser: 0.58 mg/dL (ref 0.44–1.00)
GFR, Estimated: >60 mL/min (ref >60)
Glucose, Bld: 86 mg/dL (ref 70–99)
Potassium: 3.7 mmol/L (ref 3.5–5.1)
Sodium: 138 mmol/L (ref 135–145)

## 2022-10-15 LAB — CBC
HCT: 44.4 % (ref 36.0–46.0)
Hemoglobin: 14.2 g/dL (ref 12.0–15.0)
MCH: 29.1 pg (ref 26.0–34.0)
MCHC: 32 g/dL (ref 30.0–36.0)
MCV: 91 fL (ref 80.0–100.0)
Platelets: 193 10*3/uL (ref 150–400)
RBC: 4.88 MIL/uL (ref 3.87–5.11)
RDW: 12.3 % (ref 11.5–15.5)
WBC: 7.2 10*3/uL (ref 4.0–10.5)
nRBC: 0 % (ref 0.0–0.2)

## 2022-10-15 LAB — TROPONIN I (HIGH SENSITIVITY)
Troponin I (High Sensitivity): <2 ng/L (ref <18)
Troponin I (High Sensitivity): <2 ng/L (ref <18)

## 2022-10-15 MED ORDER — IOHEXOL 350 MG/ML SOLN
75.0000 mL | Freq: Once | INTRAVENOUS | Status: AC | PRN
  Administered 2022-10-15: 75 mL via INTRAVENOUS

## 2022-10-15 NOTE — ED Provider Notes (Signed)
 Carrie [name] EMERGENCY DEPARTMENT AT [facility] Provider Note   CSN: [id number] Arrival date & time: 10/15/22  1139     History Chief Complaint  Patient presents with   Palpitations    Carrie [name] is a 52 y.o. female.  Patient presents emergency department complaints of palpitations that began yesterday.  She is reporting some associated shortness of breath as well.  She reports that the symptoms have gone away and they do fluctuate periodically.  Patient is previously evaluated by primary care provider with Holter monitor which did not show any arrhythmias.  No prior significant cardiac history not take any medications for heart health at this time.  Not currently on blood thinners.  Also not currently on any hormonal contraceptives or hormonal medications.  Denies any obvious lower leg swelling or edema but does report that a few days ago she felt she had a lump present on the anterior aspect of the right lower extremity.  She had attributed this to wearing boots recently which had rubbed against the area.   Palpitations      Home Medications Prior to Admission medications   Medication Sig Start Date End Date Taking? Authorizing Provider  acidophilus (RISAQUAD) CAPS capsule Take 1 capsule by mouth daily.   Yes [provider]  diphenhydramine-acetaminophen (TYLENOL PM) 25-500 MG TABS tablet Take by mouth.   Yes [provider]  fluticasone (FLONASE) 50 MCG/ACT nasal spray Place 1 spray into both nostrils daily.   Yes [provider]  glucosamine-chondroitin 500-400 MG tablet Take by mouth.   Yes [provider]  ibuprofen (ADVIL) 200 MG tablet Take 400 mg by mouth every 8 (eight) hours as needed for moderate pain.   Yes [provider]  Multiple Vitamin (MULTIVITAMIN) tablet Take 1 tablet by mouth daily.     Yes [provider]  Omega-3 Fatty Acids (FISH OIL) 1000 MG CAPS Take by mouth.   Yes [provider]   triamcinolone cream (KENALOG) 0.1 % Apply 1 Application topically 2 (two) times daily.   Yes [provider]      Allergies    Hydrocodone    Review of Systems   Review of Systems  Cardiovascular:  Positive for palpitations.  All other systems reviewed and are negative.   Physical Exam Updated Vital Signs BP 122/69   Pulse 69   Temp 98.5 F (36.9 C) (Oral)   Resp 17   Ht 5\' 6"  (1.676 m)   Wt 86.2 kg   SpO2 97%   BMI 30.67 kg/m  Physical Exam Vitals and nursing note reviewed.  Constitutional:      General: She is not in acute distress.    Appearance: She is well-developed.  HENT:     Head: Normocephalic and atraumatic.  Eyes:     Conjunctiva/sclera: Conjunctivae normal.  Cardiovascular:     Rate and Rhythm: Normal rate and regular rhythm.     Heart sounds: No murmur heard. Pulmonary:     Effort: Pulmonary effort is normal. No respiratory distress.     Breath sounds: Normal breath sounds.  Abdominal:     Palpations: Abdomen is soft.     Tenderness: There is no abdominal tenderness.  Musculoskeletal:        General: No swelling.     Cervical back: Neck supple.     Right lower leg: No edema.     Left lower leg: No edema.  Skin:    General: Skin is warm and  dry.     Capillary Refill: Capillary refill takes less than 2 seconds.  Neurological:     Mental Status: She is alert.  Psychiatric:        Mood and Affect: Mood normal.     ED Results / Procedures / Treatments   Labs (all labs ordered are listed, but only abnormal results are displayed) Labs Reviewed  BASIC METABOLIC PANEL  CBC  TROPONIN I (HIGH SENSITIVITY)  TROPONIN I (HIGH SENSITIVITY)    EKG None  Radiology CT Angio Chest PE W and/or Wo Contrast  Result Date: 10/15/2022 CLINICAL DATA:  Pulmonary embolism (PE) suspected, high prob. Intermittent palpitations with associated shortness of breath. EXAM: CT ANGIOGRAPHY CHEST WITH CONTRAST TECHNIQUE: Multidetector CT imaging of the chest  was performed using the standard protocol during bolus administration of intravenous contrast. Multiplanar CT image reconstructions and MIPs were obtained to evaluate the vascular anatomy. RADIATION DOSE REDUCTION: This exam was performed according to the departmental dose-optimization program which includes automated exposure control, adjustment of the mA and/or kV according to patient size and/or use of iterative reconstruction technique. CONTRAST:  75mL OMNIPAQUE IOHEXOL 350 MG/ML SOLN COMPARISON:  None Available. FINDINGS: Cardiovascular: Satisfactory opacification of the pulmonary arteries to the segmental level. No evidence of pulmonary embolism. Normal heart size. No pericardial effusion. Mediastinum/Nodes: No enlarged mediastinal, hilar, or axillary lymph nodes. Thyroid gland, trachea, and esophagus demonstrate no significant findings. Lungs/Pleura: Lungs are clear. No pleural effusion or pneumothorax. Upper Abdomen: No acute abnormality. Musculoskeletal: No chest wall abnormality. No acute or significant osseous findings. Review of the MIP images confirms the above findings. IMPRESSION: No evidence of pulmonary embolism or other acute intrathoracic process. Electronically Signed   By: Orvan Falconer M.D.   On: 10/15/2022 15:36   US Venous Img Lower Bilateral (DVT)  Result Date: 10/15/2022 CLINICAL DATA:  Pain, edema EXAM: BILATERAL LOWER EXTREMITY VENOUS DOPPLER ULTRASOUND TECHNIQUE: Gray-scale sonography with compression, as well as color and duplex ultrasound, were performed to evaluate the deep venous system(s) from the level of the common femoral vein through the popliteal and proximal calf veins. COMPARISON:  None Available. FINDINGS: VENOUS Normal compressibility of the common femoral, superficial femoral, and popliteal veins, as well as the visualized calf veins. Visualized portions of profunda femoral vein and great saphenous vein unremarkable. No filling defects to suggest DVT on grayscale or  color Doppler imaging. Doppler waveforms show normal direction of venous flow, normal respiratory plasticity and response to augmentation. OTHER There is a 1.6 x 0.5 x 1 cm lenticular anechoic collection in the deep subcutaneous tissues of the anterior right lower leg corresponding to palpable abnormality. Limitations: none IMPRESSION: 1. Negative for DVT. 2. 1.6 cm fluid collection in the deep subcutaneous tissues of the anterior right lower leg. Electronically Signed   By: Corlis Leak M.D.   On: 10/15/2022 14:26   DG Chest Port 1 View  Result Date: 10/15/2022 CLINICAL DATA:  141880 SOB (shortness of breath) [id number] EXAM: PORTABLE CHEST - 1 VIEW COMPARISON:  None Available. FINDINGS: Cardiac silhouette is unremarkable. No pneumothorax or pleural effusion. The lungs are clear. There are thoracic degenerative changes. IMPRESSION: No acute cardiopulmonary process. Electronically Signed   By: Layla Maw M.D.   On: 10/15/2022 12:41    Procedures Procedures   Medications Ordered in ED Medications  iohexol (OMNIPAQUE) 350 MG/ML injection 75 mL (75 mLs Intravenous Contrast Given 10/15/22 1520)    ED Course/ Medical Decision Making/ A&P  Medical Decision Making Amount and/or Complexity of Data Reviewed Labs: ordered. Radiology: ordered.  Risk Prescription drug management.   This patient presents to the ED for concern of palpitations.  Differential diagnosis includes atrial fibrillation, atrial flutter, PVC, PAC, sick sinus syndrome  Lab Tests:  I Ordered, and personally interpreted labs.  The pertinent results include: Normal CBC, BMP, negative troponin   Imaging Studies ordered:  I ordered imaging studies including chest x-ray, ultrasound of bilateral lower extremities, CT angio chest I independently visualized and interpreted imaging which showed no acute cardiopulmonary disease, no DVT is noted in lower extremities, no pulm embolism noted I agree with the  radiologist interpretation   Problem List / ED Course:  Patient presented to the emergency department complaints of palpitations.  She reports that the symptoms present starting yesterday and have had some associated shortness of breath with him.  Denies any prior history of any cardiac abnormalities or heart arrhythmias.  She has not previously been seen by cardiology by her primary care did order a Holter monitor previously which did not show any episodes of arrhythmia at that time.  She reports that previously when she was having these episodes she reduce caffeine intake which helped to reduce the frequency of the symptoms and she has not had them recur until just recently.  Given presentation, lab work was initiated to rule out any coronary presentation.  EKG and chest x-ray are reassuring with a negative troponin as well.  Ultrasound lower extremities were ordered as patient has a slightly swollen spot on the right lower extremity on the anterior aspect of the calf.  Ultrasound determined this was not a DVT but instead was a area of fluid.  CT angio chest also ordered which did not show any evidence of any pulmonary emboli. At this point, I believe patient is experiencing palpitations likely secondary to PVCs as seen on several runs of the EKG. Given that this is typically a benign findings and there is no acute concern for toxicity, electrolyte disturbance, or evidence of ischemia, I believe patient is safe to discharge home and follow up with cardiology for further evaluation. Patient agreeable with this treatment plan and verbalized understanding all return precautions. All questions answered prior to patient discharge.  Final Clinical Impression(s) / ED Diagnoses Final diagnoses:  Palpitations    Rx / DC Orders ED Discharge Orders          Ordered    Ambulatory referral to Cardiology       Comments: If you have not heard from the Cardiology office within the next 72 hours please call  [phone].   10/15/22 1619              Smitty Knudsen, PA-C 10/15/22 1629    Gloris Manchester, MD 10/16/22 [phone]

## 2022-10-15 NOTE — ED Triage Notes (Signed)
Pt presents with intermittent palpitations that started yesterday morning with some associated ShOB. Pt states the symptoms are not currently present. Pt states she has had symptoms in the past and was evaluated by her PCP with a Holter monitor that showed no arrhythmia's at the time. Pt does state she has some pain between shoulder blade which pt attributes to chronic neck pain and has been present before palpitations.

## 2022-10-15 NOTE — Discharge Instructions (Signed)
You were seen in the emergency department for palpitations. Thankfully your labs were largely reassuring. You also had an ultrasound of your legs which ruled out any clots. A chest CT scan also ruled out any evidence of a pulmonary embolism. Given that you are still having some intermittent episodes of palpitations with PVCs seen on your EKG, I would recommend further evaluation with cardiology for their recommendations on potential interventions if needed. For now, avoid specific things that may trigger your symptoms such as caffeine, vigorous exercise, or lack of sleep. If your symptoms worsen, please return to the emergency department.

## 2022-10-21 ENCOUNTER — Ambulatory Visit: Payer: BC Managed Care – PPO | Admitting: Internal Medicine

## 2022-10-31 ENCOUNTER — Ambulatory Visit: Payer: BC Managed Care – PPO | Admitting: Internal Medicine

## 2023-01-01 DIAGNOSIS — R531 Weakness: Secondary | ICD-10-CM | POA: Diagnosis not present

## 2023-01-01 DIAGNOSIS — M79641 Pain in right hand: Secondary | ICD-10-CM | POA: Diagnosis not present

## 2023-01-01 DIAGNOSIS — M79642 Pain in left hand: Secondary | ICD-10-CM | POA: Diagnosis not present

## 2023-01-01 DIAGNOSIS — M25642 Stiffness of left hand, not elsewhere classified: Secondary | ICD-10-CM | POA: Diagnosis not present

## 2023-01-08 DIAGNOSIS — M25642 Stiffness of left hand, not elsewhere classified: Secondary | ICD-10-CM | POA: Diagnosis not present

## 2023-01-08 DIAGNOSIS — M79641 Pain in right hand: Secondary | ICD-10-CM | POA: Diagnosis not present

## 2023-01-08 DIAGNOSIS — R531 Weakness: Secondary | ICD-10-CM | POA: Diagnosis not present

## 2023-01-08 DIAGNOSIS — M79642 Pain in left hand: Secondary | ICD-10-CM | POA: Diagnosis not present

## 2023-01-13 DIAGNOSIS — R531 Weakness: Secondary | ICD-10-CM | POA: Diagnosis not present

## 2023-01-13 DIAGNOSIS — M79642 Pain in left hand: Secondary | ICD-10-CM | POA: Diagnosis not present

## 2023-01-13 DIAGNOSIS — M25642 Stiffness of left hand, not elsewhere classified: Secondary | ICD-10-CM | POA: Diagnosis not present

## 2023-01-13 DIAGNOSIS — M79641 Pain in right hand: Secondary | ICD-10-CM | POA: Diagnosis not present

## 2023-01-15 DIAGNOSIS — S134XXA Sprain of ligaments of cervical spine, initial encounter: Secondary | ICD-10-CM | POA: Diagnosis not present

## 2023-01-15 DIAGNOSIS — S338XXA Sprain of other parts of lumbar spine and pelvis, initial encounter: Secondary | ICD-10-CM | POA: Diagnosis not present

## 2023-01-15 DIAGNOSIS — S233XXA Sprain of ligaments of thoracic spine, initial encounter: Secondary | ICD-10-CM | POA: Diagnosis not present

## 2023-01-22 DIAGNOSIS — M79642 Pain in left hand: Secondary | ICD-10-CM | POA: Diagnosis not present

## 2023-01-22 DIAGNOSIS — R531 Weakness: Secondary | ICD-10-CM | POA: Diagnosis not present

## 2023-01-22 DIAGNOSIS — M79641 Pain in right hand: Secondary | ICD-10-CM | POA: Diagnosis not present

## 2023-01-22 DIAGNOSIS — M25642 Stiffness of left hand, not elsewhere classified: Secondary | ICD-10-CM | POA: Diagnosis not present

## 2023-01-24 DIAGNOSIS — M25642 Stiffness of left hand, not elsewhere classified: Secondary | ICD-10-CM | POA: Diagnosis not present

## 2023-01-24 DIAGNOSIS — M79642 Pain in left hand: Secondary | ICD-10-CM | POA: Diagnosis not present

## 2023-01-24 DIAGNOSIS — R531 Weakness: Secondary | ICD-10-CM | POA: Diagnosis not present

## 2023-01-24 DIAGNOSIS — M79641 Pain in right hand: Secondary | ICD-10-CM | POA: Diagnosis not present

## 2023-01-29 DIAGNOSIS — M79641 Pain in right hand: Secondary | ICD-10-CM | POA: Diagnosis not present

## 2023-01-29 DIAGNOSIS — M79642 Pain in left hand: Secondary | ICD-10-CM | POA: Diagnosis not present

## 2023-01-29 DIAGNOSIS — M25642 Stiffness of left hand, not elsewhere classified: Secondary | ICD-10-CM | POA: Diagnosis not present

## 2023-01-29 DIAGNOSIS — R531 Weakness: Secondary | ICD-10-CM | POA: Diagnosis not present

## 2023-02-07 ENCOUNTER — Encounter: Payer: Self-pay | Admitting: Obstetrics and Gynecology

## 2023-02-19 ENCOUNTER — Ambulatory Visit
Admission: RE | Admit: 2023-02-19 | Discharge: 2023-02-19 | Disposition: A | Discharge status: Home / Self Care | Payer: BC Managed Care – PPO | Source: Ambulatory Visit | Attending: Obstetrics and Gynecology | Admitting: Obstetrics and Gynecology

## 2023-02-19 DIAGNOSIS — N6489 Other specified disorders of breast: Secondary | ICD-10-CM

## 2023-02-19 DIAGNOSIS — Z01419 Encounter for gynecological examination (general) (routine) without abnormal findings: Secondary | ICD-10-CM | POA: Diagnosis not present

## 2023-02-19 DIAGNOSIS — R928 Other abnormal and inconclusive findings on diagnostic imaging of breast: Secondary | ICD-10-CM | POA: Diagnosis not present

## 2023-02-19 DIAGNOSIS — Z6831 Body mass index (BMI) 31.0-31.9, adult: Secondary | ICD-10-CM | POA: Diagnosis not present

## 2023-02-19 DIAGNOSIS — Z13228 Encounter for screening for other metabolic disorders: Secondary | ICD-10-CM | POA: Diagnosis not present

## 2023-02-19 DIAGNOSIS — Z131 Encounter for screening for diabetes mellitus: Secondary | ICD-10-CM | POA: Diagnosis not present

## 2023-02-19 DIAGNOSIS — Z1272 Encounter for screening for malignant neoplasm of vagina: Secondary | ICD-10-CM | POA: Diagnosis not present

## 2023-02-19 DIAGNOSIS — Z1321 Encounter for screening for nutritional disorder: Secondary | ICD-10-CM | POA: Diagnosis not present

## 2023-02-19 DIAGNOSIS — Z1151 Encounter for screening for human papillomavirus (HPV): Secondary | ICD-10-CM | POA: Diagnosis not present

## 2023-02-19 DIAGNOSIS — Z13220 Encounter for screening for lipoid disorders: Secondary | ICD-10-CM | POA: Diagnosis not present

## 2023-02-20 ENCOUNTER — Other Ambulatory Visit (HOSPITAL_COMMUNITY): Payer: Self-pay | Admitting: Obstetrics and Gynecology

## 2023-02-20 DIAGNOSIS — Z8249 Family history of ischemic heart disease and other diseases of the circulatory system: Secondary | ICD-10-CM

## 2023-03-26 DIAGNOSIS — L570 Actinic keratosis: Secondary | ICD-10-CM | POA: Diagnosis not present

## 2023-03-26 DIAGNOSIS — L821 Other seborrheic keratosis: Secondary | ICD-10-CM | POA: Diagnosis not present

## 2023-03-26 DIAGNOSIS — L814 Other melanin hyperpigmentation: Secondary | ICD-10-CM | POA: Diagnosis not present

## 2023-03-26 DIAGNOSIS — L578 Other skin changes due to chronic exposure to nonionizing radiation: Secondary | ICD-10-CM | POA: Diagnosis not present

## 2023-03-26 DIAGNOSIS — D1801 Hemangioma of skin and subcutaneous tissue: Secondary | ICD-10-CM | POA: Diagnosis not present

## 2023-04-07 ENCOUNTER — Ambulatory Visit (HOSPITAL_COMMUNITY)
Admission: RE | Admit: 2023-04-07 | Discharge: 2023-04-07 | Disposition: A | Discharge status: Home / Self Care | Payer: BC Managed Care – PPO | Source: Ambulatory Visit | Attending: Obstetrics and Gynecology | Admitting: Obstetrics and Gynecology

## 2023-04-07 DIAGNOSIS — Z8249 Family history of ischemic heart disease and other diseases of the circulatory system: Secondary | ICD-10-CM | POA: Insufficient documentation

## 2023-04-23 DIAGNOSIS — R945 Abnormal results of liver function studies: Secondary | ICD-10-CM | POA: Diagnosis not present

## 2023-04-30 ENCOUNTER — Other Ambulatory Visit: Payer: Self-pay | Admitting: Nurse Practitioner

## 2023-04-30 DIAGNOSIS — E042 Nontoxic multinodular goiter: Secondary | ICD-10-CM | POA: Diagnosis not present

## 2023-05-07 ENCOUNTER — Ambulatory Visit
Admission: RE | Admit: 2023-05-07 | Discharge: 2023-05-07 | Disposition: A | Discharge status: Home / Self Care | Payer: BC Managed Care – PPO | Source: Ambulatory Visit | Attending: Nurse Practitioner | Admitting: Nurse Practitioner

## 2023-05-07 DIAGNOSIS — E042 Nontoxic multinodular goiter: Secondary | ICD-10-CM

## 2023-06-18 DIAGNOSIS — S134XXA Sprain of ligaments of cervical spine, initial encounter: Secondary | ICD-10-CM | POA: Diagnosis not present

## 2023-06-18 DIAGNOSIS — S338XXA Sprain of other parts of lumbar spine and pelvis, initial encounter: Secondary | ICD-10-CM | POA: Diagnosis not present

## 2023-06-18 DIAGNOSIS — S233XXA Sprain of ligaments of thoracic spine, initial encounter: Secondary | ICD-10-CM | POA: Diagnosis not present

## 2023-07-30 IMAGING — US US THYROID
1 series · 13 of 25 positions shown · non-contrast
Comparison: 09/27/2019

CLINICAL DATA: Multiple thyroid nodules, previous biopsy of the
dominant left thyroid nodule 8333.

EXAM:
THYROID ULTRASOUND
TECHNIQUE: Ultrasound examination of the thyroid gland and adjacent soft
tissues was performed.

[Series 1: us thyroid · 0.07mm/px · 13 of 45 slices shown]
[im 1/45]
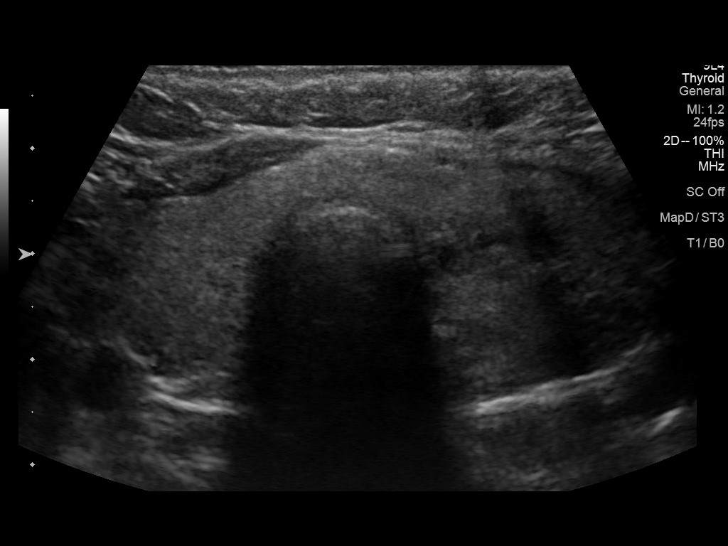
[im 4/45]
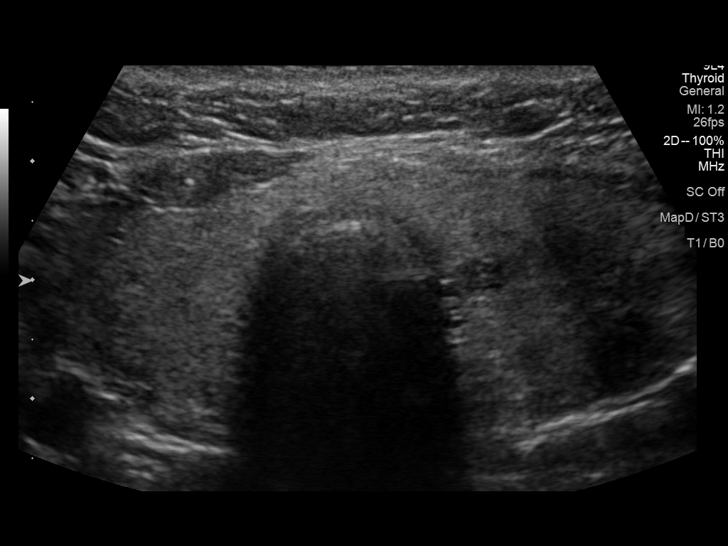
[im 8/45]
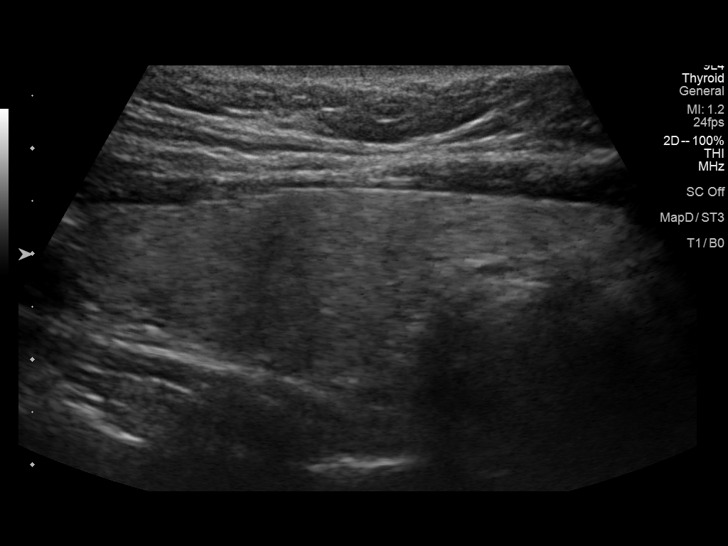
[im 12/45]
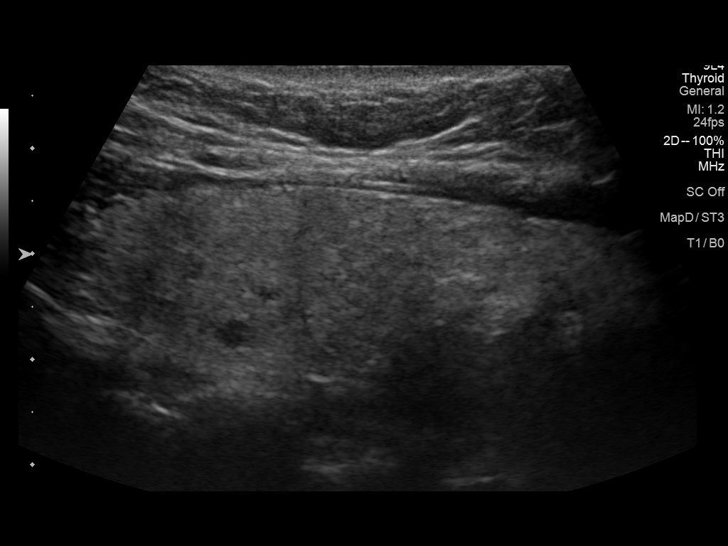
[im 15/45]
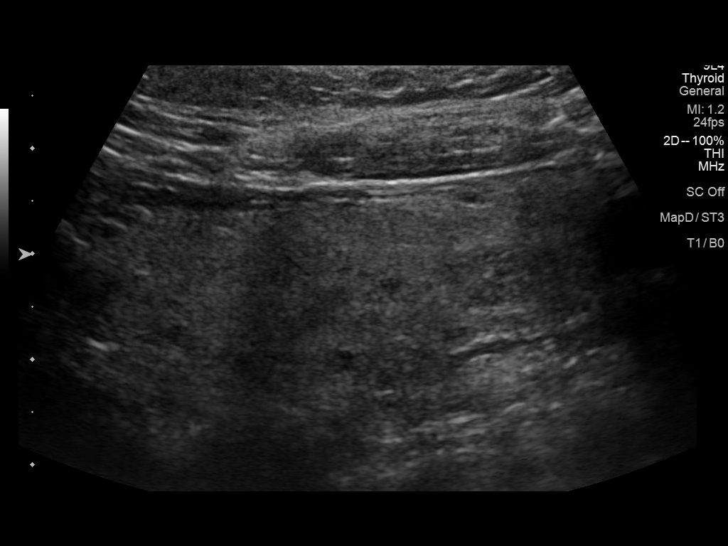
[im 19/45]
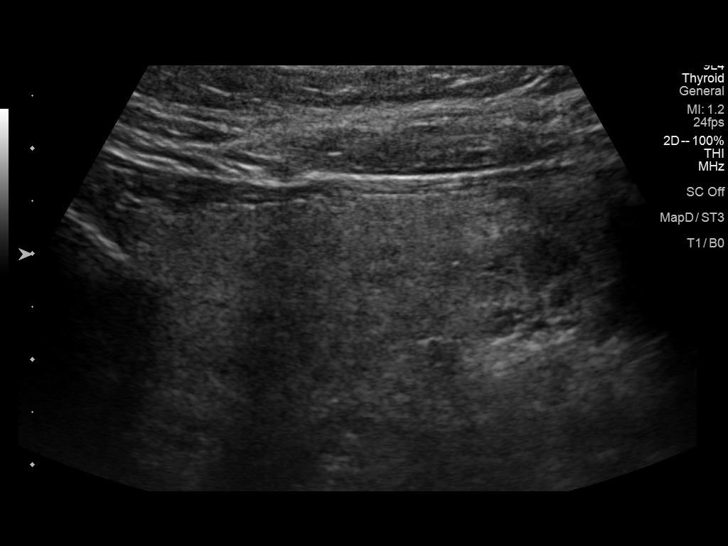
[im 23/45]
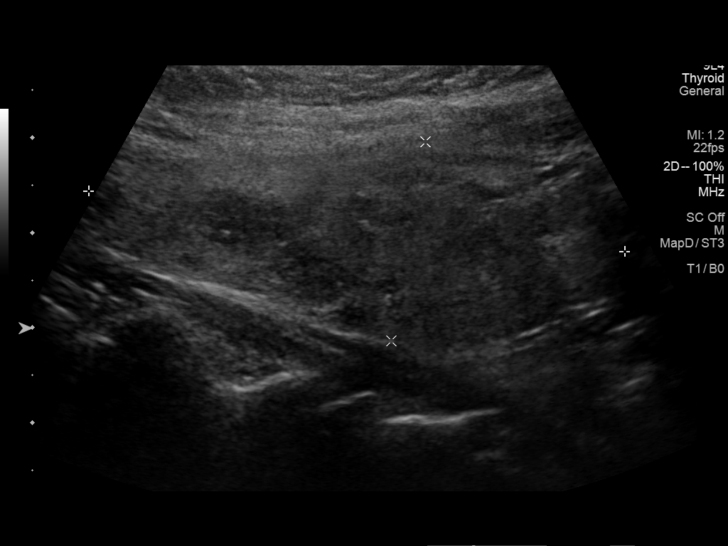
[im 26/45]
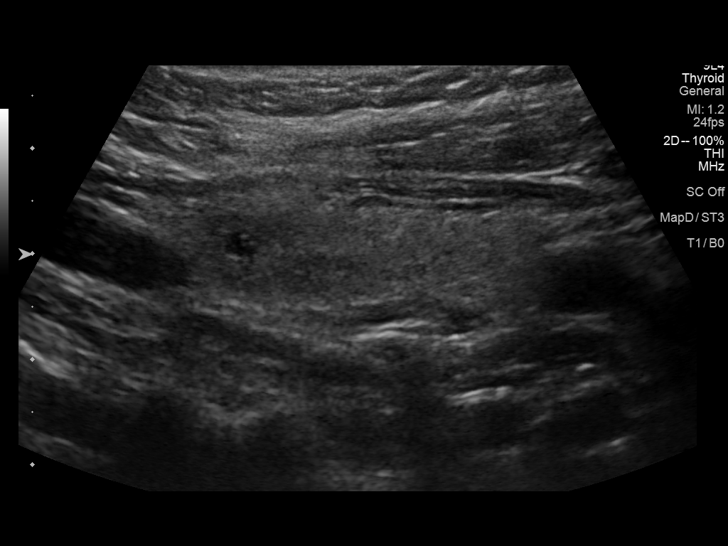
[im 30/45]
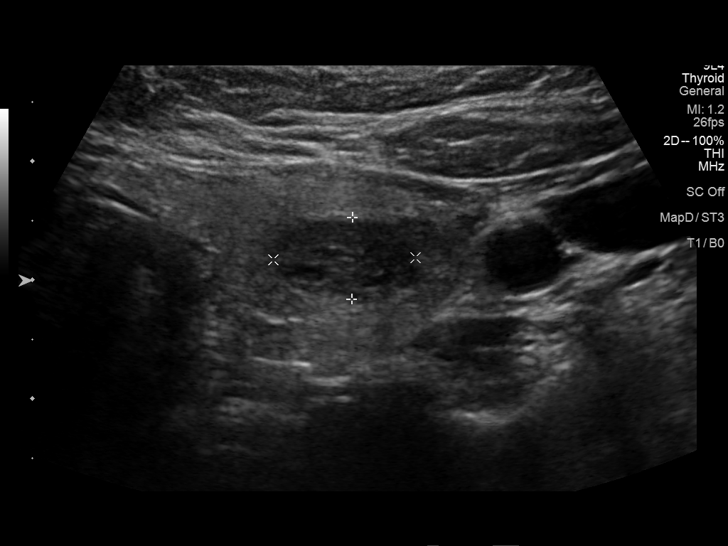
[im 34/45]
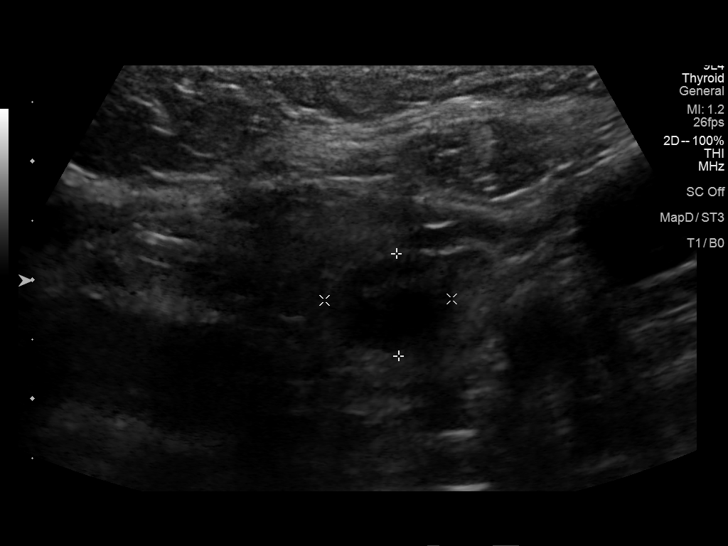
[im 37/45]
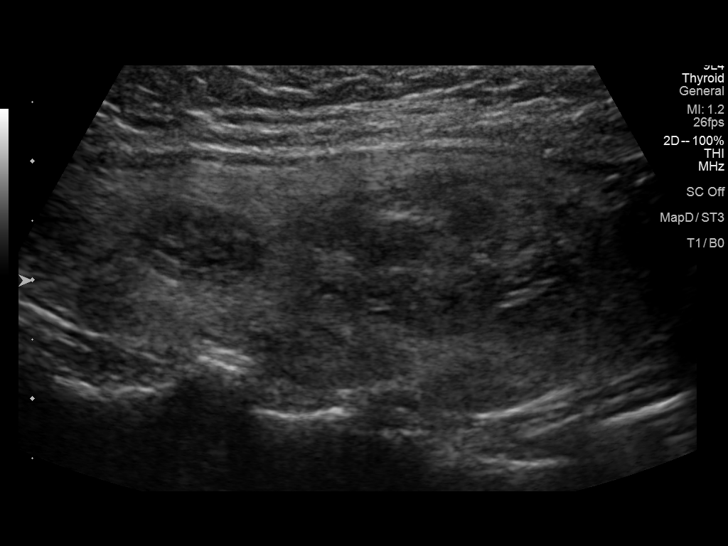
[im 41/45]
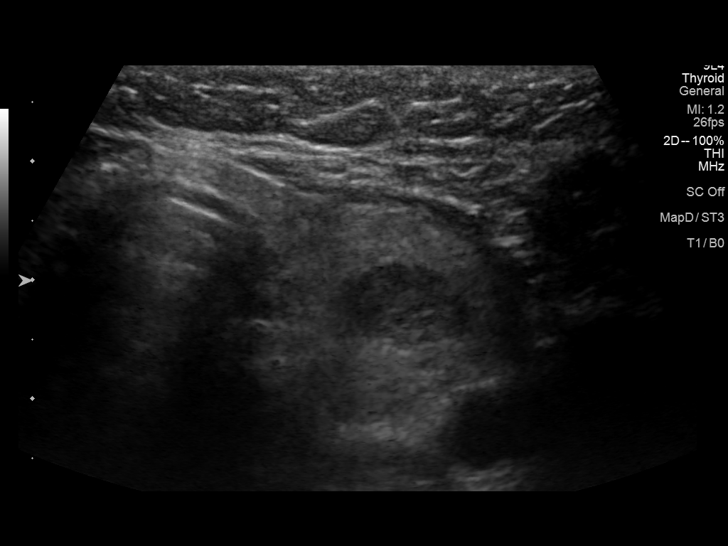
[im 45/45]
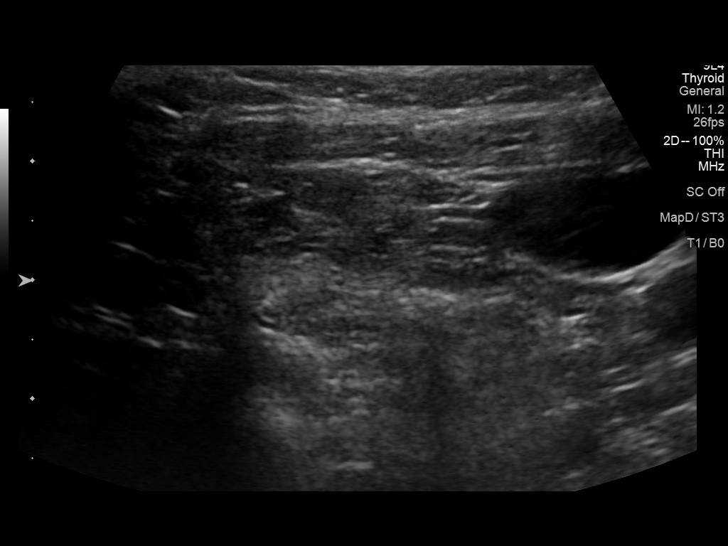

[13 of 25 positions shown; findings below may reference images not displayed]

FINDINGS: Parenchymal Echotexture: Moderately heterogenous

Isthmus: 6 mm

Right lobe: 5.6 x 1.8 x 2.2 cm

Left lobe: 5.7 x 2.1 x 1.9 cm

_________________________________________________________

Estimated total number of nodules >/= 1 cm: 3

Number of spongiform nodules >/=  2 cm not described below (TR1): 0

Number of mixed cystic and solid nodules >/= 1.5 cm not described
below (TR2): 0

_________________________________________________________

Dominant left inferior thyroid nodule is unchanged measuring 3.2 x
2.6 x 1.6 cm, previously 3.2 x 2.7 x 1.6 cm.

Left mid and inferior thyroid solid hypoechoic TR 4 type nodules
(nodules 2 in 4) measure 1.2 cm and 1.1 cm respectively. These meet
criteria for continued annual follow-up.

Additional subcentimeter nodules noted which do not meet criteria
for any biopsy or follow-up.

Normal vascularity.  No adenopathy.
IMPRESSION: Two left thyroid TR 4 nodules which are grossly stable and meet
criteria for continued annual follow-up for total of 5 years.

Dominant left inferior thyroid 3.2 cm nodule was previously biopsied
and is grossly stable in size also.

The above is in keeping with the ACR TI-RADS recommendations - [HOSPITAL] 1002;[DATE].

## 2023-10-22 DIAGNOSIS — R945 Abnormal results of liver function studies: Secondary | ICD-10-CM | POA: Diagnosis not present

## 2023-12-05 DIAGNOSIS — S233XXA Sprain of ligaments of thoracic spine, initial encounter: Secondary | ICD-10-CM | POA: Diagnosis not present

## 2023-12-05 DIAGNOSIS — S134XXA Sprain of ligaments of cervical spine, initial encounter: Secondary | ICD-10-CM | POA: Diagnosis not present

## 2023-12-05 DIAGNOSIS — S338XXA Sprain of other parts of lumbar spine and pelvis, initial encounter: Secondary | ICD-10-CM | POA: Diagnosis not present

## 2024-01-16 ENCOUNTER — Ambulatory Visit
Admission: EM | Admit: 2024-01-16 | Discharge: 2024-01-16 | Disposition: A | Discharge status: Home / Self Care | Attending: Nurse Practitioner | Admitting: Nurse Practitioner

## 2024-01-16 DIAGNOSIS — L03115 Cellulitis of right lower limb: Secondary | ICD-10-CM | POA: Diagnosis not present

## 2024-01-16 MED ORDER — DOXYCYCLINE HYCLATE 100 MG PO TABS: 100.0000 mg | ORAL_TABLET | Freq: Two times a day (BID) | ORAL | 0 refills | Status: AC

## 2024-01-16 NOTE — Discharge Instructions (Signed)
 Take medication as prescribed. Increase fluids allow for plenty of rest. You may take over-the-counter Tylenol as needed for pain, fever, or general discomfort. Apply ice to the affected area to help with pain or swelling. Continue to clean the area twice daily with warm soap and water and apply the topical antibiotic ointment you were previously using. Monitor the area for signs of worsening to include increased redness, pain, swelling, or foul-smelling drainage from the site.  If you notice any of the symptoms or have any other concerns, you may follow-up in this clinic or with your primary care physician for further evaluation. Follow-up as needed.

## 2024-01-16 NOTE — ED Triage Notes (Signed)
 Pt reports she has  a bug bite on her right lower leg x 4 days that is red and painful

## 2024-01-16 NOTE — ED Provider Notes (Signed)
 [facility]    CSN: [id number] Arrival date & time: 01/16/24  1139      History   Chief Complaint No chief complaint on file.   HPI Carrie [name] is a 53 y.o. female.   The history is provided by the patient.   Patient presents for complaints of pain and swelling to the right ankle.  Patient states she was bit by a horse fly over the past several days.  She states she has been cleaning the area with soap and water and using an antibacterial ointment.  She states over the past day or so, she has had increased redness, pain, and swelling to the site.  She denies fever, chills, foul-smelling drainage from the site, chest pain, abdominal pain, nausea, vomiting, or diarrhea.  Past Medical History:  Diagnosis Date   Anemia    Arthritis     There are no active problems to display for this patient.   Past Surgical History:  Procedure Laterality Date   ABDOMINAL HYSTERECTOMY  07/15/2009   CHOLECYSTECTOMY     TOTAL HIP ARTHROPLASTY      OB History   No obstetric history on file.      Home Medications    Prior to Admission medications   Medication Sig Start Date End Date Taking? Authorizing Provider  acidophilus (RISAQUAD) CAPS capsule Take 1 capsule by mouth daily.    [provider]  diphenhydramine-acetaminophen (TYLENOL PM) 25-500 MG TABS tablet Take by mouth.    [provider]  fluticasone (FLONASE) 50 MCG/ACT nasal spray Place 1 spray into both nostrils daily.    [provider]  glucosamine-chondroitin 500-400 MG tablet Take by mouth.    [provider]  ibuprofen (ADVIL) 200 MG tablet Take 400 mg by mouth every 8 (eight) hours as needed for moderate pain.    [provider]  Multiple Vitamin (MULTIVITAMIN) tablet Take 1 tablet by mouth daily.      [provider]  Omega-3 Fatty Acids (FISH OIL) 1000 MG CAPS Take by mouth.    [provider]  triamcinolone cream (KENALOG) 0.1 % Apply 1  Application topically 2 (two) times daily.    [provider]    Family History Family History  Problem Relation Age of Onset   Breast cancer Maternal Aunt    Breast cancer Paternal Aunt    Breast cancer Maternal Grandmother    Breast cancer Paternal Grandmother     Social History Social History   Tobacco Use   Smoking status: Unknown  Vaping Use   Vaping status: Never Used  Substance Use Topics   Alcohol  use: Not Currently    Comment: rare   Drug use: Never     Allergies   Hydrocodone   Review of Systems Review of Systems Per HPI  Physical Exam Triage Vital Signs ED Triage Vitals  Encounter Vitals Group     BP 01/16/24 1237 (!) 145/77     Girls Systolic BP Percentile --      Girls Diastolic BP Percentile --      Boys Systolic BP Percentile --      Boys Diastolic BP Percentile --      Pulse Rate 01/16/24 1237 78     Resp 01/16/24 1237 18     Temp 01/16/24 1237 98.5 F (36.9 C)     Temp Source 01/16/24 1237 Oral     SpO2 01/16/24 1237 96 %     Weight --  Height --      Head Circumference --      Peak Flow --      Pain Score 01/16/24 1238 3     Pain Loc --      Pain Education --      Exclude from Growth Chart --    No data found.  Updated Vital Signs BP (!) 145/77 (BP Location: Right Arm)   Pulse 78   Temp 98.5 F (36.9 C) (Oral)   Resp 18   SpO2 96%   Visual Acuity Right Eye Distance:   Left Eye Distance:   Bilateral Distance:    Right Eye Near:   Left Eye Near:    Bilateral Near:     Physical Exam Vitals and nursing note reviewed.  Constitutional:      General: She is not in acute distress.    Appearance: Normal appearance.  HENT:     Head: Normocephalic.  Eyes:     Extraocular Movements: Extraocular movements intact.     Pupils: Pupils are equal, round, and reactive to light.  Cardiovascular:     Rate and Rhythm: Normal rate and regular rhythm.     Pulses: Normal pulses.     Heart sounds: Normal heart sounds.   Pulmonary:     Effort: Pulmonary effort is normal. No respiratory distress.     Breath sounds: Normal breath sounds. No stridor. No wheezing, rhonchi or rales.  Abdominal:     General: Bowel sounds are normal.     Palpations: Abdomen is soft.  Musculoskeletal:     Cervical back: Normal range of motion.  Skin:    General: Skin is warm and dry.      Neurological:     General: No focal deficit present.     Mental Status: She is alert and oriented to person, place, and time.  Psychiatric:        Mood and Affect: Mood normal.        Behavior: Behavior normal.      UC Treatments / Results  Labs (all labs ordered are listed, but only abnormal results are displayed) Labs Reviewed - No data to display  EKG   Radiology No results found.  Procedures Procedures (including critical care time)  Medications Ordered in UC Medications - No data to display  Initial Impression / Assessment and Plan / UC Course  I have reviewed the triage vital signs and the nursing notes.  Pertinent labs & imaging results that were available during my care of the patient were reviewed by me and considered in my medical decision making (see chart for details).  Patient with an insect bite to the right lower extremity.  On exam, lung sounds are clear throughout, room air sats at 96%.  Patient does have redness, erythema, warmth, and swelling to the right lower extremity.  Will treat for cellulitis with doxycycline  100 mg twice daily.  Supportive care recommendations were provided and discussed with the patient to include over-the-counter analgesics, the use of ice, and to monitor for signs of worsening.  Discussed indications with patient regarding follow-up.  Patient was in agreement with this plan of care and verbalizes understanding.  All questions were answered.  Patient stable for discharge.  Final Clinical Impressions(s) / UC Diagnoses   Final diagnoses:  None   Discharge Instructions   None     ED Prescriptions   None    PDMP not reviewed this encounter.   Gilmer Etta PARAS, NP 01/16/24 1253

## 2024-01-21 ENCOUNTER — Other Ambulatory Visit: Payer: Self-pay | Admitting: Obstetrics and Gynecology

## 2024-01-21 DIAGNOSIS — Z1231 Encounter for screening mammogram for malignant neoplasm of breast: Secondary | ICD-10-CM

## 2024-02-19 ENCOUNTER — Other Ambulatory Visit: Payer: Self-pay | Admitting: Obstetrics and Gynecology

## 2024-02-19 DIAGNOSIS — N6489 Other specified disorders of breast: Secondary | ICD-10-CM

## 2024-02-19 DIAGNOSIS — Z1231 Encounter for screening mammogram for malignant neoplasm of breast: Secondary | ICD-10-CM

## 2024-02-19 DIAGNOSIS — Z09 Encounter for follow-up examination after completed treatment for conditions other than malignant neoplasm: Secondary | ICD-10-CM

## 2024-02-25 ENCOUNTER — Ambulatory Visit
Admission: RE | Admit: 2024-02-25 | Discharge: 2024-02-25 | Disposition: A | Discharge status: Home / Self Care | Source: Ambulatory Visit | Attending: Obstetrics and Gynecology | Admitting: Obstetrics and Gynecology

## 2024-02-25 DIAGNOSIS — Z6832 Body mass index (BMI) 32.0-32.9, adult: Secondary | ICD-10-CM | POA: Diagnosis not present

## 2024-02-25 DIAGNOSIS — Z09 Encounter for follow-up examination after completed treatment for conditions other than malignant neoplasm: Secondary | ICD-10-CM

## 2024-02-25 DIAGNOSIS — Z01419 Encounter for gynecological examination (general) (routine) without abnormal findings: Secondary | ICD-10-CM | POA: Diagnosis not present

## 2024-02-25 DIAGNOSIS — N6489 Other specified disorders of breast: Secondary | ICD-10-CM

## 2024-02-25 DIAGNOSIS — R928 Other abnormal and inconclusive findings on diagnostic imaging of breast: Secondary | ICD-10-CM | POA: Diagnosis not present

## 2024-03-10 DIAGNOSIS — S134XXA Sprain of ligaments of cervical spine, initial encounter: Secondary | ICD-10-CM | POA: Diagnosis not present

## 2024-03-10 DIAGNOSIS — S233XXA Sprain of ligaments of thoracic spine, initial encounter: Secondary | ICD-10-CM | POA: Diagnosis not present

## 2024-03-10 DIAGNOSIS — S338XXA Sprain of other parts of lumbar spine and pelvis, initial encounter: Secondary | ICD-10-CM | POA: Diagnosis not present

## 2024-03-31 DIAGNOSIS — Z86018 Personal history of other benign neoplasm: Secondary | ICD-10-CM | POA: Diagnosis not present

## 2024-03-31 DIAGNOSIS — L570 Actinic keratosis: Secondary | ICD-10-CM | POA: Diagnosis not present

## 2024-03-31 DIAGNOSIS — D229 Melanocytic nevi, unspecified: Secondary | ICD-10-CM | POA: Diagnosis not present

## 2024-03-31 DIAGNOSIS — L814 Other melanin hyperpigmentation: Secondary | ICD-10-CM | POA: Diagnosis not present

## 2024-03-31 DIAGNOSIS — L578 Other skin changes due to chronic exposure to nonionizing radiation: Secondary | ICD-10-CM | POA: Diagnosis not present

## 2024-04-21 DIAGNOSIS — D485 Neoplasm of uncertain behavior of skin: Secondary | ICD-10-CM | POA: Diagnosis not present

## 2024-04-21 DIAGNOSIS — D225 Melanocytic nevi of trunk: Secondary | ICD-10-CM | POA: Diagnosis not present

## 2024-04-29 DIAGNOSIS — M25561 Pain in right knee: Secondary | ICD-10-CM | POA: Diagnosis not present

## 2024-05-05 DIAGNOSIS — S83231A Complex tear of medial meniscus, current injury, right knee, initial encounter: Secondary | ICD-10-CM | POA: Diagnosis not present

## 2024-05-17 DIAGNOSIS — S134XXA Sprain of ligaments of cervical spine, initial encounter: Secondary | ICD-10-CM | POA: Diagnosis not present

## 2024-05-17 DIAGNOSIS — S338XXA Sprain of other parts of lumbar spine and pelvis, initial encounter: Secondary | ICD-10-CM | POA: Diagnosis not present

## 2024-05-17 DIAGNOSIS — S233XXA Sprain of ligaments of thoracic spine, initial encounter: Secondary | ICD-10-CM | POA: Diagnosis not present

## 2024-05-19 DIAGNOSIS — G8918 Other acute postprocedural pain: Secondary | ICD-10-CM | POA: Diagnosis not present

## 2024-05-19 DIAGNOSIS — M65861 Other synovitis and tenosynovitis, right lower leg: Secondary | ICD-10-CM | POA: Diagnosis not present

## 2024-05-19 DIAGNOSIS — M6751 Plica syndrome, right knee: Secondary | ICD-10-CM | POA: Diagnosis not present

## 2024-05-19 DIAGNOSIS — S83231A Complex tear of medial meniscus, current injury, right knee, initial encounter: Secondary | ICD-10-CM | POA: Diagnosis not present

## 2024-05-19 DIAGNOSIS — Y999 Unspecified external cause status: Secondary | ICD-10-CM | POA: Diagnosis not present

## 2024-05-19 DIAGNOSIS — M94261 Chondromalacia, right knee: Secondary | ICD-10-CM | POA: Diagnosis not present

## 2024-05-19 DIAGNOSIS — X58XXXA Exposure to other specified factors, initial encounter: Secondary | ICD-10-CM | POA: Diagnosis not present

## 2024-05-31 DIAGNOSIS — M2569 Stiffness of other specified joint, not elsewhere classified: Secondary | ICD-10-CM | POA: Diagnosis not present

## 2024-05-31 DIAGNOSIS — M25561 Pain in right knee: Secondary | ICD-10-CM | POA: Diagnosis not present
